# Patient Record
Sex: Female | Born: 1965 | Race: White | Hispanic: No | Smoking: Never smoker
Health system: Southern US, Community
[De-identification: ages and names within clinical notes are randomized; demographics above are authoritative.]

## PROBLEM LIST (undated history)

## (undated) DIAGNOSIS — R7303 Prediabetes: Secondary | ICD-10-CM

## (undated) DIAGNOSIS — G2581 Restless legs syndrome: Secondary | ICD-10-CM

## (undated) DIAGNOSIS — F419 Anxiety disorder, unspecified: Secondary | ICD-10-CM

## (undated) DIAGNOSIS — D649 Anemia, unspecified: Secondary | ICD-10-CM

## (undated) DIAGNOSIS — J45909 Unspecified asthma, uncomplicated: Secondary | ICD-10-CM

## (undated) DIAGNOSIS — E079 Disorder of thyroid, unspecified: Secondary | ICD-10-CM

## (undated) DIAGNOSIS — G4730 Sleep apnea, unspecified: Secondary | ICD-10-CM

## (undated) DIAGNOSIS — M1990 Unspecified osteoarthritis, unspecified site: Secondary | ICD-10-CM

## (undated) DIAGNOSIS — Z8489 Family history of other specified conditions: Secondary | ICD-10-CM

## (undated) DIAGNOSIS — E039 Hypothyroidism, unspecified: Secondary | ICD-10-CM

## (undated) HISTORY — PX: LEG SURGERY: SHX1003

## (undated) HISTORY — PX: BACK SURGERY: SHX140

## (undated) HISTORY — PX: ABDOMINAL HYSTERECTOMY: SHX81

## (undated) HISTORY — PX: APPENDECTOMY: SHX54

## (undated) HISTORY — PX: ENDOMETRIAL ABLATION: SHX621

---

## 2004-01-06 ENCOUNTER — Other Ambulatory Visit: Payer: Self-pay

## 2004-07-05 ENCOUNTER — Ambulatory Visit: Payer: Self-pay

## 2004-09-27 ENCOUNTER — Ambulatory Visit: Payer: Self-pay

## 2005-07-26 ENCOUNTER — Ambulatory Visit: Payer: Self-pay | Admitting: Obstetrics and Gynecology

## 2006-01-18 ENCOUNTER — Other Ambulatory Visit: Payer: Self-pay

## 2006-01-18 ENCOUNTER — Inpatient Hospital Stay: Payer: Self-pay | Admitting: Internal Medicine

## 2006-10-10 ENCOUNTER — Ambulatory Visit: Payer: Self-pay | Admitting: Family Medicine

## 2007-05-28 ENCOUNTER — Ambulatory Visit: Payer: Self-pay | Admitting: Obstetrics and Gynecology

## 2007-07-05 ENCOUNTER — Ambulatory Visit: Payer: Self-pay | Admitting: Orthopedic Surgery

## 2007-07-10 ENCOUNTER — Ambulatory Visit: Payer: Self-pay | Admitting: Orthopedic Surgery

## 2008-06-16 ENCOUNTER — Emergency Department: Payer: Self-pay | Admitting: Emergency Medicine

## 2008-09-02 ENCOUNTER — Ambulatory Visit: Payer: Self-pay

## 2010-02-15 ENCOUNTER — Ambulatory Visit: Payer: Self-pay

## 2010-12-08 ENCOUNTER — Ambulatory Visit (INDEPENDENT_AMBULATORY_CARE_PROVIDER_SITE_OTHER): Payer: Self-pay

## 2010-12-08 ENCOUNTER — Inpatient Hospital Stay (INDEPENDENT_AMBULATORY_CARE_PROVIDER_SITE_OTHER)
Admission: RE | Admit: 2010-12-08 | Discharge: 2010-12-08 | Disposition: A | Discharge status: Home / Self Care | Payer: Self-pay | Source: Ambulatory Visit | Attending: Family Medicine | Admitting: Family Medicine

## 2010-12-08 DIAGNOSIS — R071 Chest pain on breathing: Secondary | ICD-10-CM

## 2012-01-19 ENCOUNTER — Emergency Department: Payer: Self-pay | Admitting: Emergency Medicine

## 2013-05-09 ENCOUNTER — Emergency Department: Payer: Self-pay | Admitting: Emergency Medicine

## 2013-05-09 LAB — URINALYSIS, COMPLETE
Ketone: NEGATIVE
Leukocyte Esterase: NEGATIVE
Protein: NEGATIVE

## 2013-05-09 LAB — BASIC METABOLIC PANEL
Chloride: 108 mmol/L — ABNORMAL HIGH (ref 98–107)
Co2: 27 mmol/L (ref 21–32)
EGFR (Non-African Amer.): >60
Glucose: 95 mg/dL (ref 65–99)

## 2013-05-09 LAB — CBC
MCV: 80 fL (ref 80–100)
RBC: 4.34 10*6/uL (ref 3.80–5.20)
RDW: 14.9 % — ABNORMAL HIGH (ref 11.5–14.5)

## 2013-06-04 ENCOUNTER — Ambulatory Visit: Payer: Self-pay

## 2013-12-07 ENCOUNTER — Emergency Department: Payer: Self-pay | Admitting: Emergency Medicine

## 2013-12-07 LAB — BASIC METABOLIC PANEL
ANION GAP: 8 (ref 7–16)
BUN: 21 mg/dL — AB (ref 7–18)
Calcium, Total: 8.9 mg/dL (ref 8.5–10.1)
Chloride: 106 mmol/L (ref 98–107)
Co2: 24 mmol/L (ref 21–32)
Creatinine: 1.09 mg/dL (ref 0.60–1.30)
EGFR (African American): >60
GFR CALC NON AF AMER: 60 — AB
Glucose: 94 mg/dL (ref 65–99)
Osmolality: 278 (ref 275–301)
POTASSIUM: 3.9 mmol/L (ref 3.5–5.1)
SODIUM: 138 mmol/L (ref 136–145)

## 2013-12-07 LAB — MAGNESIUM: Magnesium: 1.7 mg/dL — ABNORMAL LOW

## 2014-09-19 ENCOUNTER — Emergency Department
Admit: 2014-09-19 | Disposition: A | Discharge status: Home / Self Care | Payer: Self-pay | Admitting: Emergency Medicine

## 2014-09-19 LAB — COMPREHENSIVE METABOLIC PANEL
ALBUMIN: 4.2 g/dL
ALK PHOS: 71 U/L
ALT: 21 U/L
ANION GAP: 8 (ref 7–16)
AST: 26 U/L
BILIRUBIN TOTAL: 0.6 mg/dL
BUN: 15 mg/dL
CALCIUM: 9.1 mg/dL
CHLORIDE: 104 mmol/L
CO2: 23 mmol/L
CREATININE: 1.06 mg/dL — AB
EGFR (African American): >60
EGFR (Non-African Amer.): >60
Glucose: 109 mg/dL — ABNORMAL HIGH
POTASSIUM: 3.8 mmol/L
SODIUM: 135 mmol/L
TOTAL PROTEIN: 7.9 g/dL

## 2014-09-19 LAB — URINALYSIS, COMPLETE
BILIRUBIN, UR: NEGATIVE
GLUCOSE, UR: NEGATIVE mg/dL (ref 0–75)
KETONE: NEGATIVE
Leukocyte Esterase: NEGATIVE
Nitrite: NEGATIVE
Ph: 5 (ref 4.5–8.0)
SPECIFIC GRAVITY: 1.03 (ref 1.003–1.030)
Squamous Epithelial: 13

## 2014-09-19 LAB — CBC
HCT: 36.2 % (ref 35.0–47.0)
HGB: 11.3 g/dL — ABNORMAL LOW (ref 12.0–16.0)
MCH: 24.9 pg — AB (ref 26.0–34.0)
MCHC: 31.1 g/dL — AB (ref 32.0–36.0)
MCV: 80 fL (ref 80–100)
PLATELETS: 283 10*3/uL (ref 150–440)
RBC: 4.52 10*6/uL (ref 3.80–5.20)
RDW: 15.7 % — AB (ref 11.5–14.5)
WBC: 10.4 10*3/uL (ref 3.6–11.0)

## 2014-11-10 DIAGNOSIS — D509 Iron deficiency anemia, unspecified: Secondary | ICD-10-CM | POA: Insufficient documentation

## 2014-12-15 ENCOUNTER — Ambulatory Visit: Payer: Self-pay

## 2015-03-22 DIAGNOSIS — F329 Major depressive disorder, single episode, unspecified: Secondary | ICD-10-CM | POA: Insufficient documentation

## 2015-06-10 DIAGNOSIS — J40 Bronchitis, not specified as acute or chronic: Secondary | ICD-10-CM | POA: Insufficient documentation

## 2015-09-17 ENCOUNTER — Emergency Department: Payer: BLUE CROSS/BLUE SHIELD

## 2015-09-17 ENCOUNTER — Emergency Department
Admission: EM | Admit: 2015-09-17 | Discharge: 2015-09-17 | Disposition: A | Discharge status: Home / Self Care | Payer: BLUE CROSS/BLUE SHIELD | Attending: Emergency Medicine | Admitting: Emergency Medicine

## 2015-09-17 ENCOUNTER — Encounter: Payer: Self-pay | Admitting: Emergency Medicine

## 2015-09-17 DIAGNOSIS — Y999 Unspecified external cause status: Secondary | ICD-10-CM | POA: Insufficient documentation

## 2015-09-17 DIAGNOSIS — X501XXA Overexertion from prolonged static or awkward postures, initial encounter: Secondary | ICD-10-CM | POA: Diagnosis not present

## 2015-09-17 DIAGNOSIS — S39012A Strain of muscle, fascia and tendon of lower back, initial encounter: Secondary | ICD-10-CM

## 2015-09-17 DIAGNOSIS — B372 Candidiasis of skin and nail: Secondary | ICD-10-CM | POA: Diagnosis not present

## 2015-09-17 DIAGNOSIS — Y939 Activity, unspecified: Secondary | ICD-10-CM | POA: Diagnosis not present

## 2015-09-17 DIAGNOSIS — Y929 Unspecified place or not applicable: Secondary | ICD-10-CM | POA: Diagnosis not present

## 2015-09-17 DIAGNOSIS — E119 Type 2 diabetes mellitus without complications: Secondary | ICD-10-CM | POA: Diagnosis not present

## 2015-09-17 DIAGNOSIS — E079 Disorder of thyroid, unspecified: Secondary | ICD-10-CM | POA: Diagnosis not present

## 2015-09-17 DIAGNOSIS — M545 Low back pain: Secondary | ICD-10-CM | POA: Diagnosis present

## 2015-09-17 HISTORY — DX: Disorder of thyroid, unspecified: E07.9

## 2015-09-17 HISTORY — DX: Restless legs syndrome: G25.81

## 2015-09-17 MED ORDER — NYSTATIN 100000 UNIT/GM EX CREA: 1.0000 "application " | TOPICAL_CREAM | Freq: Two times a day (BID) | CUTANEOUS | Status: DC

## 2015-09-17 MED ORDER — METHOCARBAMOL 500 MG PO TABS: 500.0000 mg | ORAL_TABLET | Freq: Four times a day (QID) | ORAL | Status: DC

## 2015-09-17 MED ORDER — FLUCONAZOLE 150 MG PO TABS: 150.0000 mg | ORAL_TABLET | Freq: Once | ORAL | Status: DC

## 2015-09-17 MED ORDER — MELOXICAM 15 MG PO TABS: 15.0000 mg | ORAL_TABLET | Freq: Every day | ORAL | Status: DC

## 2015-09-17 NOTE — ED Provider Notes (Signed)
[name] Emergency Department Provider Note  ____________________________________________  Time seen: Approximately 3:41 PM  I have reviewed the triage vital signs and the nursing notes.   HISTORY  Chief Complaint Back Pain    HPI Marie Booker is a 50 y.o. female who presents emergency department complaining of lower back pain 3 weeks. Patient states that she has had surgery to her lower back and has had some problems with stability in her legs. She states that her left leg gave out and she twisted and fell. She did not land on her back but states that she has had increasing aches and spasms in her lower back since occurrence. Patient denies any urinary symptoms complaints. Patient denies any radicular symptoms. She denies any bowel or bladder dysfunction, paresthesias, numbness or tingling in distal extremity.  Patient also endorses a cutaneous yeast infection to the skin folds of her lower abdomen. Patient states that she frequently gets these during the warmer months. She is requesting treatment for same.   Past Medical History  Diagnosis Date  . Diabetes mellitus without complication (HCC)   . Thyroid disease   . RLS (restless legs syndrome)     There are no active problems to display for this patient.   Past Surgical History  Procedure Laterality Date  . Back surgery    . Leg surgery      Current Outpatient Rx  Name  Route  Sig  Dispense  Refill  . fluconazole (DIFLUCAN) 150 MG tablet   Oral   Take 1 tablet (150 mg total) by mouth once.   1 tablet   0   . meloxicam (MOBIC) 15 MG tablet   Oral   Take 1 tablet (15 mg total) by mouth daily.   30 tablet   0   . methocarbamol (ROBAXIN) 500 MG tablet   Oral   Take 1 tablet (500 mg total) by mouth 4 (four) times daily.   16 tablet   0   . nystatin cream (MYCOSTATIN)   Topical   Apply 1 application topically 2 (two) times daily.   30 g   0     Allergies Dilaudid  History  reviewed. No pertinent family history.  Social History Social History  Substance Use Topics  . Smoking status: Never Smoker   . Smokeless tobacco: None  . Alcohol Use: No     Review of Systems  Constitutional: No fever/chills Cardiovascular: no chest pain. Respiratory: no cough. No SOB. Gastrointestinal: No abdominal pain.  No nausea, no vomiting.   Genitourinary: Negative for dysuria. No hematuria Musculoskeletal: Positive for lower back pain. Skin: Positive for yeast infection to skin folds of the lower abdomen. Neurological: Negative for headaches, focal weakness or numbness. 10-point ROS otherwise negative.  ____________________________________________   PHYSICAL EXAM:  VITAL SIGNS: ED Triage Vitals  Enc Vitals Group     BP 09/17/15 1415 132/68 mmHg     Pulse Rate 09/17/15 1415 76     Resp 09/17/15 1415 18     Temp 09/17/15 1415 98 F (36.7 C)     Temp Source 09/17/15 1415 Oral     SpO2 09/17/15 1415 98 %     Weight 09/17/15 1415 260 lb (117.935 kg)     Height 09/17/15 1415  (1.778 m)     Head Cir --      Peak Flow --      Pain Score 09/17/15 1416 7     Pain Loc --  Pain Edu? --      Excl. in GC? --      Constitutional: Alert and oriented. Well appearing and in no acute distress. Eyes: Conjunctivae are normal. PERRL. EOMI. Head: Atraumatic. Cardiovascular: Normal rate, regular rhythm. Normal S1 and S2.  Good peripheral circulation. Respiratory: Normal respiratory effort without tachypnea or retractions. Lungs CTAB. Gastrointestinal:  No CVA tenderness. Musculoskeletal: No visible deformity to spine upon inspection. Patient is tender to palpation midline spinal processes. Patient is diffuse tender to palpation over the paraspinal muscle groups bilaterally in the lumbar region. No point tenderness. No palpable abnormality. Patient is nontender to palpation over sciatic notches. Negative straight leg raise bilaterally. Dorsalis pedis pulses appreciated  distally bilaterally. Sensation intact and equal lower extremities. Neurologic:  Normal speech and language. No gross focal neurologic deficits are appreciated.  Skin:  Skin is warm, dry and intact. Minor erythema noted in the skin folds lower abdomen. He does have white coating consistent with yeast. Psychiatric: Mood and affect are normal. Speech and behavior are normal. Patient exhibits appropriate insight and judgement.   ____________________________________________   LABS (all labs ordered are listed, but only abnormal results are displayed)  Labs Reviewed - No data to display ____________________________________________  EKG   ____________________________________________  RADIOLOGY Marie Booker, personally viewed and evaluated these images (plain radiographs) as part of my medical decision making, as well as reviewing the written report by the radiologist.  Dg Lumbar Spine Complete  09/17/2015  CLINICAL DATA:  Patient slipped 3 weeks ago with persistent left-sided back pain radiating towards lower extremity. EXAM: LUMBAR SPINE - COMPLETE 4+ VIEW COMPARISON:  CT 09/20/2014 . FINDINGS: Exam demonstrates mild spondylosis throughout the lumbar spine. There is disc space narrowing most prominent at the L4-5 level and to a lesser extent at the L3-4 and L5-S1 levels. There is a mild grade 1 anterolisthesis of L4 with respect to L3 and L5 likely due to moderate facet arthropathy unchanged. No evidence of compression fracture. There are degenerative changes of the sacroiliac joints and hips. IMPRESSION: No acute findings per Mild spondylosis throughout the lumbar spine to include facet arthropathy. Disc disease worse at the L4-5 level and to lesser extent at the L3-4 and L5-S1 levels. Mild grade 1 anterolisthesis of L4 with respect to L3 and L5 likely due to facet arthropathy and unchanged. Electronically Signed   By: Elberta Fortis M.D.   On: 09/17/2015 15:34     ____________________________________________    PROCEDURES  Procedure(s) performed:       Medications - No data to display   ____________________________________________   INITIAL IMPRESSION / ASSESSMENT AND PLAN / ED COURSE  Pertinent labs & imaging results that were available during my care of the patient were reviewed by me and considered in my medical decision making (see chart for details).  Patient's diagnosis is consistent with lumbar strain and cutaneous yeast infection. Patient's x-ray is negative. The emergency department. Patient's exam is reassuring.. Patient will be discharged home with prescriptions for anti-inflammatories muscle relaxers and medication for yeast infection. Patient is to follow-up with neurosurgeon in 2-3 weeks medications have not relieved symptoms of back pain.. Patient is to follow up with primary care provider if symptoms of yeast infection persist past this treatment course. Patient is given ED precautions to return to the ED for any worsening or new symptoms.     ____________________________________________  FINAL CLINICAL IMPRESSION(S) / ED DIAGNOSES  Final diagnoses:  Strain of lumbar paraspinal muscle, initial encounter  Candidiasis  of skin      NEW MEDICATIONS STARTED DURING THIS VISIT:  New Prescriptions   FLUCONAZOLE (DIFLUCAN) 150 MG TABLET    Take 1 tablet (150 mg total) by mouth once.   MELOXICAM (MOBIC) 15 MG TABLET    Take 1 tablet (15 mg total) by mouth daily.   METHOCARBAMOL (ROBAXIN) 500 MG TABLET    Take 1 tablet (500 mg total) by mouth 4 (four) times daily.   NYSTATIN CREAM (MYCOSTATIN)    Apply 1 application topically 2 (two) times daily.        This chart was dictated using voice recognition software/Dragon. Despite best efforts to proofread, errors can occur which can change the meaning. Any change was purely unintentional.    Racheal PatchesJonathan D Jordie Skalsky, PA-C 09/17/15 1559  Loleta Roseory Forbach, MD 09/18/15  [phone]

## 2015-09-17 NOTE — Discharge Instructions (Signed)
Lumbosacral Strain Lumbosacral strain is a strain of any of the parts that make up your lumbosacral vertebrae. Your lumbosacral vertebrae are the bones that make up the lower third of your backbone. Your lumbosacral vertebrae are held together by muscles and tough, fibrous tissue (ligaments).  CAUSES  A sudden blow to your back can cause lumbosacral strain. Also, anything that causes an excessive stretch of the muscles in the low back can cause this strain. This is typically seen when people exert themselves strenuously, fall, lift heavy objects, bend, or crouch repeatedly. RISK FACTORS  Physically demanding work.  Participation in pushing or pulling sports or sports that require a sudden twist of the back (tennis, golf, baseball).  Weight lifting.  Excessive lower back curvature.  Forward-tilted pelvis.  Weak back or abdominal muscles or both.  Tight hamstrings. SIGNS AND SYMPTOMS  Lumbosacral strain may cause pain in the area of your injury or pain that moves (radiates) down your leg.  DIAGNOSIS Your health care provider can often diagnose lumbosacral strain through a physical exam. In some cases, you may need tests such as X-ray exams.  TREATMENT  Treatment for your lower back injury depends on many factors that your clinician will have to evaluate. However, most treatment will include the use of anti-inflammatory medicines. HOME CARE INSTRUCTIONS   Avoid hard physical activities (tennis, racquetball, waterskiing) if you are not in proper physical condition for it. This may aggravate or create problems.  If you have a back problem, avoid sports requiring sudden body movements. Swimming and walking are generally safer activities.  Maintain good posture.  Maintain a healthy weight.  For acute conditions, you may put ice on the injured area.  Put ice in a plastic bag.  Place a towel between your skin and the bag.  Leave the ice on for 20 minutes, 2-3 times a day.  When the  low back starts healing, stretching and strengthening exercises may be recommended. SEEK MEDICAL CARE IF:  Your back pain is getting worse.  You experience severe back pain not relieved with medicines. SEEK IMMEDIATE MEDICAL CARE IF:   You have numbness, tingling, weakness, or problems with the use of your arms or legs.  There is a change in bowel or bladder control.  You have increasing pain in any area of the body, including your belly (abdomen).  You notice shortness of breath, dizziness, or feel faint.  You feel sick to your stomach (nauseous), are throwing up (vomiting), or become sweaty.  You notice discoloration of your toes or legs, or your feet get very cold. MAKE SURE YOU:   Understand these instructions.  Will watch your condition.  Will get help right away if you are not doing well or get worse.   This information is not intended to replace advice given to you by your health care provider. Make sure you discuss any questions you have with your health care provider.   Document Released: 03/15/2005 Document Revised: 06/26/2014 Document Reviewed: 01/22/2013 Elsevier Interactive Patient Education 2016 [employer]  Cutaneous Candidiasis Cutaneous candidiasis is a condition in which there is an overgrowth of yeast (candida) on the skin. Yeast normally live on the skin, but in small enough numbers not to cause any symptoms. In certain cases, increased growth of the yeast may cause an actual yeast infection. This kind of infection usually occurs in areas of the skin that are constantly warm and moist, such as the armpits or the groin. Yeast is the most common cause of  diaper rash in babies and in people who cannot control their bowel movements (incontinence). CAUSES  The fungus that most often causes cutaneous candidiasis is Candida albicans. Conditions that can increase the risk of getting a yeast infection of the skin  include:  Obesity.  Pregnancy.  Diabetes.  Taking antibiotic medicine.  Taking birth control pills.  Taking steroid medicines.  Thyroid disease.  An iron or zinc deficiency.  Problems with the immune system. SYMPTOMS   Red, swollen area of the skin.  Bumps on the skin.  Itchiness. DIAGNOSIS  The diagnosis of cutaneous candidiasis is usually based on its appearance. Light scrapings of the skin may also be taken and viewed under a microscope to identify the presence of yeast. TREATMENT  Antifungal creams may be applied to the infected skin. In severe cases, oral medicines may be needed.  HOME CARE INSTRUCTIONS   Keep your skin clean and dry.  Maintain a healthy weight.  If you have diabetes, keep your blood sugar under control. SEEK IMMEDIATE MEDICAL CARE IF:  Your rash continues to spread despite treatment.  You have a fever, chills, or abdominal pain.   This information is not intended to replace advice given to you by your health care provider. Make sure you discuss any questions you have with your health care provider.   Document Released: 02/21/2011 Document Revised: 08/28/2011 Document Reviewed: 12/07/2014 Elsevier Interactive Patient Education [employer]

## 2015-09-17 NOTE — ED Notes (Signed)
Pt has had intermittent lower back pain and knee pain.  Pt did fall 3 weeks ago and not sure if this triggered it.  Has had lumbar laminectomy end of last year and this is where back pain is.  Denies loss of bowel or bladder.  Pain worst when laying flat.

## 2016-02-03 ENCOUNTER — Other Ambulatory Visit: Payer: Self-pay | Admitting: Specialist

## 2016-02-03 DIAGNOSIS — M545 Low back pain: Secondary | ICD-10-CM

## 2016-02-11 ENCOUNTER — Ambulatory Visit: Payer: BLUE CROSS/BLUE SHIELD

## 2016-02-13 ENCOUNTER — Emergency Department
Admission: EM | Admit: 2016-02-13 | Discharge: 2016-02-13 | Discharge status: Against Medical Advice | Payer: BLUE CROSS/BLUE SHIELD

## 2016-02-13 NOTE — ED Notes (Signed)
Pt called for triage multiple times with no answer.

## 2016-04-14 DIAGNOSIS — F419 Anxiety disorder, unspecified: Secondary | ICD-10-CM | POA: Insufficient documentation

## 2016-05-15 ENCOUNTER — Emergency Department
Admission: EM | Admit: 2016-05-15 | Discharge: 2016-05-15 | Disposition: A | Discharge status: Home / Self Care | Payer: BLUE CROSS/BLUE SHIELD | Attending: Emergency Medicine | Admitting: Emergency Medicine

## 2016-05-15 ENCOUNTER — Encounter: Payer: Self-pay | Admitting: Emergency Medicine

## 2016-05-15 DIAGNOSIS — E119 Type 2 diabetes mellitus without complications: Secondary | ICD-10-CM | POA: Diagnosis not present

## 2016-05-15 DIAGNOSIS — R1110 Vomiting, unspecified: Secondary | ICD-10-CM

## 2016-05-15 DIAGNOSIS — R112 Nausea with vomiting, unspecified: Secondary | ICD-10-CM | POA: Insufficient documentation

## 2016-05-15 DIAGNOSIS — R109 Unspecified abdominal pain: Secondary | ICD-10-CM | POA: Insufficient documentation

## 2016-05-15 DIAGNOSIS — R197 Diarrhea, unspecified: Secondary | ICD-10-CM | POA: Insufficient documentation

## 2016-05-15 LAB — COMPREHENSIVE METABOLIC PANEL
ALBUMIN: 4.3 g/dL (ref 3.5–5.0)
ALK PHOS: 67 U/L (ref 38–126)
ALT: 17 U/L (ref 14–54)
ANION GAP: 8 (ref 5–15)
AST: 24 U/L (ref 15–41)
BILIRUBIN TOTAL: 0.8 mg/dL (ref 0.3–1.2)
BUN: 15 mg/dL (ref 6–20)
CO2: 24 mmol/L (ref 22–32)
Calcium: 9.1 mg/dL (ref 8.9–10.3)
Chloride: 102 mmol/L (ref 101–111)
Creatinine, Ser: 0.96 mg/dL (ref 0.44–1.00)
GFR calc Af Amer: >60 mL/min (ref >60)
GFR calc non Af Amer: >60 mL/min (ref >60)
GLUCOSE: 106 mg/dL — AB (ref 65–99)
POTASSIUM: 3.9 mmol/L (ref 3.5–5.1)
SODIUM: 134 mmol/L — AB (ref 135–145)
Total Protein: 7.8 g/dL (ref 6.5–8.1)

## 2016-05-15 LAB — CBC
HEMATOCRIT: 44.5 % (ref 35.0–47.0)
HEMOGLOBIN: 14.9 g/dL (ref 12.0–16.0)
MCH: 28.6 pg (ref 26.0–34.0)
MCHC: 33.5 g/dL (ref 32.0–36.0)
MCV: 85.4 fL (ref 80.0–100.0)
Platelets: 217 10*3/uL (ref 150–440)
RBC: 5.21 MIL/uL — ABNORMAL HIGH (ref 3.80–5.20)
RDW: 13.1 % (ref 11.5–14.5)
WBC: 7.8 10*3/uL (ref 3.6–11.0)

## 2016-05-15 LAB — LIPASE, BLOOD: Lipase: 22 U/L (ref 11–51)

## 2016-05-15 LAB — TROPONIN I: Troponin I: <0.03 ng/mL (ref <0.03)

## 2016-05-15 MED ORDER — ONDANSETRON HCL 4 MG/2ML IJ SOLN
4.0000 mg | Freq: Once | INTRAMUSCULAR | Status: AC
  Administered 2016-05-15: 4 mg via INTRAVENOUS
  Filled 2016-05-15: qty 2

## 2016-05-15 MED ORDER — ONDANSETRON HCL 4 MG PO TABS: 4.0000 mg | ORAL_TABLET | Freq: Three times a day (TID) | ORAL | 0 refills | Status: DC | PRN

## 2016-05-15 MED ORDER — CYCLOBENZAPRINE HCL 5 MG PO TABS: 5.0000 mg | ORAL_TABLET | Freq: Three times a day (TID) | ORAL | 0 refills | Status: DC | PRN

## 2016-05-15 MED ORDER — SODIUM CHLORIDE 0.9 % IV BOLUS (SEPSIS)
1000.0000 mL | Freq: Once | INTRAVENOUS | Status: AC
  Administered 2016-05-15: 1000 mL via INTRAVENOUS

## 2016-05-15 MED ORDER — CYCLOBENZAPRINE HCL 10 MG PO TABS
5.0000 mg | ORAL_TABLET | Freq: Once | ORAL | Status: AC
  Administered 2016-05-15: 5 mg via ORAL
  Filled 2016-05-15: qty 1

## 2016-05-15 NOTE — ED Notes (Signed)
Pt reports she has been vomiting for the last 48 hours and diarrhea - she states she has pulled a muscle in her chest/back due to the vomiting - vomited 20+ times in 24 hours - 15 loose stools in 24 hours

## 2016-05-15 NOTE — ED Triage Notes (Signed)
Nausea, vomiting, abd pain and chest pain and diarrhea x 2 days.

## 2016-05-15 NOTE — Discharge Instructions (Signed)
Please seek medical attention for any high fevers, chest pain, shortness of breath, change in behavior, persistent vomiting, bloody stool or any other new or concerning symptoms.  

## 2016-05-15 NOTE — ED Provider Notes (Signed)
[facility] Emergency Department Provider Note    ____________________________________________   I have reviewed the triage vital signs and the nursing notes.   HISTORY  Chief Complaint Abdominal Pain and Chest Pain   History limited by: Not Limited   HPI Marie Booker is a 50 y.o. female who presents to the emergency department today because of concerns for nausea, vomiting, diarrhea, back and chest pain. The patient states that her symptoms started yesterday. She states she has now vomited and had multiple episodes of diarrhea. She has noticed some blood and occasionally vomits. Since the vomiting and diarrhea started she has felt extremely weak and dehydrated. In addition she thinks she has pulled her lower back and a muscle in her chest. She does have a history of chronic lower back pain and states that the tramadol she has been taking at home is not helping with it. She denies any known sick contacts. Denies any abnormal ingestions.   Past Medical History:  Diagnosis Date  . Diabetes mellitus without complication (HCC)   . RLS (restless legs syndrome)   . Thyroid disease     There are no active problems to display for this patient.   Past Surgical History:  Procedure Laterality Date  . BACK SURGERY    . LEG SURGERY      Prior to Admission medications   Medication Sig Start Date End Date Taking? Authorizing Provider  fluconazole (DIFLUCAN) 150 MG tablet Take 1 tablet (150 mg total) by mouth once. 09/17/15   Delorise RoyalsJonathan D Cuthriell, PA-C  meloxicam (MOBIC) 15 MG tablet Take 1 tablet (15 mg total) by mouth daily. 09/17/15   Delorise RoyalsJonathan D Cuthriell, PA-C  methocarbamol (ROBAXIN) 500 MG tablet Take 1 tablet (500 mg total) by mouth 4 (four) times daily. 09/17/15   Delorise RoyalsJonathan D Cuthriell, PA-C  nystatin cream (MYCOSTATIN) Apply 1 application topically 2 (two) times daily. 09/17/15   Delorise RoyalsJonathan D Cuthriell, PA-C    Allergies Dilaudid [hydromorphone hcl]  No  family history on file.  Social History Social History  Substance Use Topics  . Smoking status: Never Smoker  . Smokeless tobacco: Not on file  . Alcohol use No    Review of Systems  Constitutional: Negative for fever. Cardiovascular: Positive for chest pain. Respiratory: Negative for shortness of breath. Gastrointestinal: Positive for abdominal pain, nausea, vomiting, diarrhea. Genitourinary: Negative for dysuria. Musculoskeletal: Positive for back pain. Skin: Negative for rash. Neurological: Negative for headaches, focal weakness or numbness.  10-point ROS otherwise negative.  ____________________________________________   PHYSICAL EXAM:  VITAL SIGNS: ED Triage Vitals  Enc Vitals Group     BP 05/15/16 1508 (!) 137/98     Pulse Rate 05/15/16 1508 (!) 102     Resp 05/15/16 1508 20     Temp 05/15/16 1508 98.6 F (37 C)     Temp Source 05/15/16 1508 Oral     SpO2 05/15/16 1508 96 %     Weight 05/15/16 1508 251 lb (113.9 kg)     Height 05/15/16 1508 5\' 10"  (1.778 m)     Head Circumference --      Peak Flow --      Pain Score 05/15/16 1513 10   Constitutional: Alert and oriented. Well appearing and in no distress. Eyes: Conjunctivae are normal. Normal extraocular movements. ENT   Head: Normocephalic and atraumatic.   Nose: No congestion/rhinnorhea.   Mouth/Throat: Mucous membranes are moist.   Neck: No stridor. Hematological/Lymphatic/Immunilogical: No cervical lymphadenopathy. Cardiovascular: Normal rate, regular  rhythm.  No murmurs, rubs, or gallops.  Respiratory: Normal respiratory effort without tachypnea nor retractions. Breath sounds are clear and equal bilaterally. No wheezes/rales/rhonchi. Gastrointestinal: Soft and somewhat diffusely tender to palpation.  Genitourinary: Deferred Musculoskeletal: Normal range of motion in all extremities. No lower extremity edema. Tender to palpation in the right lower back. Neurologic:  Normal speech and  language. No gross focal neurologic deficits are appreciated.  Skin:  Skin is warm, dry and intact. No rash noted. Psychiatric: Mood and affect are normal. Speech and behavior are normal. Patient exhibits appropriate insight and judgment.  ____________________________________________    LABS (pertinent positives/negatives)  Labs Reviewed  COMPREHENSIVE METABOLIC PANEL - Abnormal; Notable for the following:       Result Value   Sodium 134 (*)    Glucose, Bld 106 (*)    All other components within normal limits  CBC - Abnormal; Notable for the following:    RBC 5.21 (*)    All other components within normal limits  LIPASE, BLOOD  TROPONIN I     ____________________________________________   EKG  I, Phineas SemenGraydon Cylah Fannin, attending physician, personally viewed and interpreted this EKG  EKG Time: 1507 Rate: 103 Rhythm: sinus tachycardia Axis: normal Intervals: qtc 421 QRS: narrow ST changes: no st elevation Impression: abnormal ekg  ____________________________________________    RADIOLOGY  None   ____________________________________________   PROCEDURES  Procedures  ____________________________________________   INITIAL IMPRESSION / ASSESSMENT AND PLAN / ED COURSE  Pertinent labs & imaging results that were available during my care of the patient were reviewed by me and considered in my medical decision making (see chart for details).  Patient here with nausea and vomiting. He subsequently developed back and chest pain which she thinks is likely due to muscle strains. On exam patient appears slightly uncomfortable. Will give IV fluids, Flexeril and reassess.  Clinical Course    Patient states she feels much better after fluids and medication. She was able to get up and walk to the bathroom with much less weakness. Will plan on discharging home with muscle relaxer for back and nausea medicine. ____________________________________________   FINAL CLINICAL  IMPRESSION(S) / ED DIAGNOSES  Final diagnoses:  Vomiting and diarrhea     Note: This dictation was prepared with Dragon dictation. Any transcriptional errors that result from this process are unintentional    Phineas SemenGraydon Eulla Kochanowski, MD 05/15/16 1940

## 2017-01-17 ENCOUNTER — Emergency Department
Admission: EM | Admit: 2017-01-17 | Discharge: 2017-01-17 | Disposition: A | Discharge status: Home / Self Care | Payer: BLUE CROSS/BLUE SHIELD | Attending: Emergency Medicine | Admitting: Emergency Medicine

## 2017-01-17 DIAGNOSIS — Z79899 Other long term (current) drug therapy: Secondary | ICD-10-CM | POA: Diagnosis not present

## 2017-01-17 DIAGNOSIS — R112 Nausea with vomiting, unspecified: Secondary | ICD-10-CM | POA: Insufficient documentation

## 2017-01-17 DIAGNOSIS — E119 Type 2 diabetes mellitus without complications: Secondary | ICD-10-CM | POA: Insufficient documentation

## 2017-01-17 DIAGNOSIS — R197 Diarrhea, unspecified: Secondary | ICD-10-CM | POA: Insufficient documentation

## 2017-01-17 DIAGNOSIS — M549 Dorsalgia, unspecified: Secondary | ICD-10-CM | POA: Insufficient documentation

## 2017-01-17 DIAGNOSIS — G8929 Other chronic pain: Secondary | ICD-10-CM | POA: Diagnosis not present

## 2017-01-17 DIAGNOSIS — R109 Unspecified abdominal pain: Secondary | ICD-10-CM | POA: Diagnosis present

## 2017-01-17 LAB — CBC
HCT: 41.4 % (ref 35.0–47.0)
HEMOGLOBIN: 14.2 g/dL (ref 12.0–16.0)
MCH: 29.9 pg (ref 26.0–34.0)
MCHC: 34.3 g/dL (ref 32.0–36.0)
MCV: 87.1 fL (ref 80.0–100.0)
PLATELETS: 254 10*3/uL (ref 150–440)
RBC: 4.76 MIL/uL (ref 3.80–5.20)
RDW: 14.7 % — ABNORMAL HIGH (ref 11.5–14.5)
WBC: 8.9 10*3/uL (ref 3.6–11.0)

## 2017-01-17 LAB — COMPREHENSIVE METABOLIC PANEL
ALK PHOS: 71 U/L (ref 38–126)
ALT: 30 U/L (ref 14–54)
ANION GAP: 10 (ref 5–15)
AST: 41 U/L (ref 15–41)
Albumin: 5 g/dL (ref 3.5–5.0)
BILIRUBIN TOTAL: 0.7 mg/dL (ref 0.3–1.2)
BUN: 18 mg/dL (ref 6–20)
CALCIUM: 9.4 mg/dL (ref 8.9–10.3)
CO2: 22 mmol/L (ref 22–32)
CREATININE: 1.17 mg/dL — AB (ref 0.44–1.00)
Chloride: 107 mmol/L (ref 101–111)
GFR, EST NON AFRICAN AMERICAN: 52 mL/min — AB (ref >60)
Glucose, Bld: 97 mg/dL (ref 65–99)
Potassium: 3.9 mmol/L (ref 3.5–5.1)
SODIUM: 139 mmol/L (ref 135–145)
TOTAL PROTEIN: 7.9 g/dL (ref 6.5–8.1)

## 2017-01-17 LAB — URINALYSIS, COMPLETE (UACMP) WITH MICROSCOPIC
Bacteria, UA: NONE SEEN
Bilirubin Urine: NEGATIVE
GLUCOSE, UA: NEGATIVE mg/dL
Ketones, ur: NEGATIVE mg/dL
NITRITE: NEGATIVE
PH: 5 (ref 5.0–8.0)
Protein, ur: NEGATIVE mg/dL
Specific Gravity, Urine: 1.027 (ref 1.005–1.030)

## 2017-01-17 LAB — LIPASE, BLOOD: Lipase: 32 U/L (ref 11–51)

## 2017-01-17 LAB — POCT PREGNANCY, URINE: Preg Test, Ur: NEGATIVE

## 2017-01-17 MED ORDER — ONDANSETRON HCL 4 MG/2ML IJ SOLN
4.0000 mg | Freq: Once | INTRAMUSCULAR | Status: AC
  Administered 2017-01-17: 4 mg via INTRAVENOUS

## 2017-01-17 MED ORDER — ONDANSETRON HCL 4 MG/2ML IJ SOLN
INTRAMUSCULAR | Status: AC
  Administered 2017-01-17: 4 mg via INTRAVENOUS
  Filled 2017-01-17: qty 2

## 2017-01-17 MED ORDER — MORPHINE SULFATE (PF) 4 MG/ML IV SOLN
INTRAVENOUS | Status: AC
  Administered 2017-01-17: 4 mg via INTRAVENOUS
  Filled 2017-01-17: qty 1

## 2017-01-17 MED ORDER — SODIUM CHLORIDE 0.9 % IV BOLUS (SEPSIS)
1000.0000 mL | Freq: Once | INTRAVENOUS | Status: AC
  Administered 2017-01-17: 1000 mL via INTRAVENOUS

## 2017-01-17 MED ORDER — ONDANSETRON HCL 4 MG PO TABS: 4.0000 mg | ORAL_TABLET | Freq: Every day | ORAL | 0 refills | Status: DC | PRN

## 2017-01-17 MED ORDER — MORPHINE SULFATE (PF) 4 MG/ML IV SOLN
4.0000 mg | Freq: Once | INTRAVENOUS | Status: AC
  Administered 2017-01-17: 4 mg via INTRAVENOUS

## 2017-01-17 NOTE — ED Notes (Signed)
poct pregnancy Negative 

## 2017-01-17 NOTE — ED Provider Notes (Addendum)
[facility] Emergency Department Provider Note  ____________________________________________   I have reviewed the triage vital signs and the nursing notes.   HISTORY  Chief Complaint Abdominal Pain and Diarrhea    HPI Marie Booker is a 51 y.o. female who presents today complaining of requiring morphine essentially. Patient states that she has chronic back pain, and she takes tramadol for this with the tramadol upsets her stomach. She is scheduled for back surgery for her chronic back pain and a couple weeks and she is trying to "make it". Patient has Percocet which doesn't seem to upset her stomach or for some reason she doesn't think that except for at night. Therefore, she is here because the last 2 weeks whenever she takes her tramadol she gets an upset stomach but he was stools and sometimes nausea. She is here because of these reasons and also she needs IV morphine she states. She states that her pain gets as bad and she is due for her pain meds that she can't take continued IV morphine. She states she does have to emergency room for this with some frequency although I have not verified this. She is not having fever chills or focal abdominal pain, she just feels that her chronic back pain is causing her to take a lot of pain medication and the pain medication makes her sick. She does not have any chest pain or shortness of breath she denies any dysuria or urinary frequency, she actually is not having diarrhea today, she states she was very nauseated today and only had 3 crackers to eat. Aside from morbid obesity and chronic back pain, patient appears to be largely healthy. She does have diabetes mellitus, but her sugars are probably been well-controlled.   Past Medical History:  Diagnosis Date  . Diabetes mellitus without complication (HCC)   . RLS (restless legs syndrome)   . Thyroid disease     There are no active problems to display for this patient.   Past  Surgical History:  Procedure Laterality Date  . BACK SURGERY    . LEG SURGERY      Prior to Admission medications   Medication Sig Start Date End Date Taking? Authorizing Provider  cyclobenzaprine (FLEXERIL) 5 MG tablet Take 1 tablet (5 mg total) by mouth 3 (three) times daily as needed for muscle spasms. 05/15/16   Phineas SemenGoodman, Graydon, MD  fluconazole (DIFLUCAN) 150 MG tablet Take 1 tablet (150 mg total) by mouth once. 09/17/15   Cuthriell, Delorise RoyalsJonathan D, PA-C  meloxicam (MOBIC) 15 MG tablet Take 1 tablet (15 mg total) by mouth daily. 09/17/15   Cuthriell, Delorise RoyalsJonathan D, PA-C  methocarbamol (ROBAXIN) 500 MG tablet Take 1 tablet (500 mg total) by mouth 4 (four) times daily. 09/17/15   Cuthriell, Delorise RoyalsJonathan D, PA-C  nystatin cream (MYCOSTATIN) Apply 1 application topically 2 (two) times daily. 09/17/15   Cuthriell, Delorise RoyalsJonathan D, PA-C  ondansetron (ZOFRAN) 4 MG tablet Take 1 tablet (4 mg total) by mouth every 8 (eight) hours as needed for nausea or vomiting. 05/15/16   Phineas SemenGoodman, Graydon, MD    Allergies Dilaudid [hydromorphone hcl]  No family history on file.  Social History Social History  Substance Use Topics  . Smoking status: Never Smoker  . Smokeless tobacco: Not on file  . Alcohol use No    Review of Systems Constitutional: No fever/chills Eyes: No visual changes. ENT: No sore throat. No stiff neck no neck pain Cardiovascular: Denies chest pain. Respiratory: Denies shortness of breath.  Gastrointestinal:   See history of present illness Genitourinary: Negative for dysuria. Musculoskeletal: Negative lower extremity swelling Skin: Negative for rash. Neurological: Negative for severe headaches, focal weakness or numbness.   ____________________________________________   PHYSICAL EXAM:  VITAL SIGNS: ED Triage Vitals  Enc Vitals Group     BP 01/17/17 1700 (!) 145/85     Pulse Rate 01/17/17 1700 63     Resp 01/17/17 1700 20     Temp 01/17/17 1700 98.2 F (36.8 C)     Temp Source  01/17/17 1700 Oral     SpO2 01/17/17 1700 99 %     Weight 01/17/17 1700 265 lb (120.2 kg)     Height 01/17/17 1700 5\' 7"  (1.702 m)     Head Circumference --      Peak Flow --      Pain Score 01/17/17 1659 6     Pain Loc --      Pain Edu? --      Excl. in GC? --     Constitutional: Alert and oriented. Well appearing and in no acute distress.She is anxious and upset but in no acute medical distress. Eyes: Conjunctivae are normal Head: Atraumatic HEENT: No congestion/rhinnorhea. Mucous membranes are moist.  Oropharynx non-erythematous Neck:   Nontender with no meningismus, no masses, no stridor Cardiovascular: Normal rate, regular rhythm. Grossly normal heart sounds.  Good peripheral circulation. Respiratory: Normal respiratory effort.  No retractions. Lungs CTAB. Abdominal: Soft and nontender. No distention. No guarding no rebound morbid obesity noted Back:  There is no focal tenderness or step off.  there is no midline tenderness there are no lesions noted. there is no CVA tenderness Musculoskeletal: No lower extremity tenderness, no upper extremity tenderness. No joint effusions, no DVT signs strong distal pulses no edema Neurologic:  Normal speech and language. No gross focal neurologic deficits are appreciated.  Skin:  Skin is warm, dry and intact. No rash noted. Psychiatric: Mood and affect are normal. Speech and behavior are normal.  ____________________________________________   LABS (all labs ordered are listed, but only abnormal results are displayed)  Labs Reviewed  COMPREHENSIVE METABOLIC PANEL - Abnormal; Notable for the following:       Result Value   Creatinine, Ser 1.17 (*)    GFR calc non Af Amer 52 (*)    All other components within normal limits  CBC - Abnormal; Notable for the following:    RDW 14.7 (*)    All other components within normal limits  URINALYSIS, COMPLETE (UACMP) WITH MICROSCOPIC - Abnormal; Notable for the following:    Color, Urine YELLOW (*)     APPearance HAZY (*)    Hgb urine dipstick SMALL (*)    Leukocytes, UA TRACE (*)    Squamous Epithelial / LPF 6-30 (*)    All other components within normal limits  LIPASE, BLOOD  POC URINE PREG, ED  POCT PREGNANCY, URINE   ____________________________________________  EKG  I personally interpreted any EKGs ordered by me or triage  ____________________________________________  RADIOLOGY  I reviewed any imaging ordered by me or triage that were performed during my shift and, if possible, patient and/or family made aware of any abnormal findings. ____________________________________________   PROCEDURES  Procedure(s) performed: None  Procedures  Critical Care performed: None  ____________________________________________   INITIAL IMPRESSION / ASSESSMENT AND PLAN / ED COURSE  Pertinent labs & imaging results that were available during my care of the patient were reviewed by me and considered in my medical decision  making (see chart for details).  Patient here for nausea vomiting diarrhea which she states is been going on for 2 weeks because of her pain medications. Her tongue she takes her pain medications, tramadol, she has these reactions. She does have other pain medications which also causes reaction but for some reason she is not taking them. It is very unclear why. Patient is here because she wants IV morphine. I initially thought she could not have this in that she had a ride home. She has not selected a ride. We will therefore give her pain medication. I have explained that usually we do not give chronic pain patients IV morphine that I worry patient may be having some early withdrawal symptoms which would just exacerbate nausea and diarrhea that she states she is having. Patient has not had any vomiting here. She states she might vomit though. She is very tearful and upset. She very much would like IV narcotics. Her abdomen is completely benign, her vital signs are  reassuring, she is not pregnant, her lipase is reassuring, her liver function tests are reassuring, her creatinine is reassuring, no evidence of significant dehydration, white count is reassuring, urinalysis is reassuring, abdominal exam is completely reassuring. Very reassuring exam and workup. We'll give her some pain meds and states she feels better.   ----------------------------------------- 8:00 PM on 01/17/2017 ----------------------------------------- Patient feels much better, tolerating by mouth, no actual vomiting here, no diarrhea, abdomen benign, alleged intact nothing to suggest cauda equina, she knows she must not drive, ride is here to drive her home. She is asking for another shot of pain medication not for pain but "so I can sleep better tonight". I wonder unfortunately we cannot do that. Patient does have home pain medication. I did offer her a Percocet and she declined because she "already has at home". We will see what try to get her safely home. She is in no distress and eager to be discharged. She would like to go home with nausea medication which were provided. Return precautions and follow-up given and understood.  ____________________________________________   FINAL CLINICAL IMPRESSION(S) / ED DIAGNOSES  Final diagnoses:  None      This chart was dictated using voice recognition software.  Despite best efforts to proofread,  errors can occur which can change meaning.      Jeanmarie Plant, MD 01/17/17 1904    Jeanmarie Plant, MD 01/17/17 2001

## 2017-01-17 NOTE — Discharge Instructions (Signed)
Return to the emergency room for new or worrisome symptoms including numbness, weakness, abdominal pain, incontinence of bowel or bladder, fever, or any other new or worrisome symptoms. Follow closely with her primary care doctor. Take all medications as prescribed. You  must not drive home after being in the emergency room tonight or drive later today until tomorrow because you  did receive IV morphine. Do not drive on any sedating medications.

## 2017-01-17 NOTE — ED Notes (Signed)
Pt reports feeling increased weakness and reports feeling as though "I am dehydrated and need fluids" Pt reports her legs feel like "butter" and she feels unable to walk recently. Pt also verbalized her chronic back pain increases at night and she has skipped her pain medication doses today and normally takes 100 mg tramadol three times a day with a percocet to sleep.   Pt also reports she has been a chronic pain medication pt for back pain. Pt reports she believes the medication has injured the lining of her stomach and she is no longer able to eat without pain in stomach.

## 2017-01-17 NOTE — ED Notes (Signed)
signature pad failed. Paper signature attached to paper chart.

## 2017-01-17 NOTE — ED Triage Notes (Signed)
Pt to triage via wheelchair.  Pt has abd pain with diarrhea.  Diarrhea x 5 today.  Decreased appetite.  Pt has back surgery scheduled the end of August    Pt alert

## 2017-01-17 NOTE — ED Notes (Addendum)
Pt reports having nausea after morphine administration. MD made aware.

## 2017-04-27 DIAGNOSIS — M189 Osteoarthritis of first carpometacarpal joint, unspecified: Secondary | ICD-10-CM | POA: Insufficient documentation

## 2017-05-21 ENCOUNTER — Other Ambulatory Visit: Payer: Self-pay | Admitting: Family

## 2017-05-21 DIAGNOSIS — Z1239 Encounter for other screening for malignant neoplasm of breast: Secondary | ICD-10-CM

## 2017-06-23 ENCOUNTER — Emergency Department (HOSPITAL_COMMUNITY): Payer: BLUE CROSS/BLUE SHIELD

## 2017-06-23 ENCOUNTER — Emergency Department (HOSPITAL_COMMUNITY)
Admission: EM | Admit: 2017-06-23 | Discharge: 2017-06-23 | Disposition: A | Discharge status: Home / Self Care | Payer: BLUE CROSS/BLUE SHIELD | Attending: Emergency Medicine | Admitting: Emergency Medicine

## 2017-06-23 ENCOUNTER — Other Ambulatory Visit: Payer: Self-pay

## 2017-06-23 ENCOUNTER — Encounter (HOSPITAL_COMMUNITY): Payer: Self-pay | Admitting: Obstetrics and Gynecology

## 2017-06-23 DIAGNOSIS — S0502XA Injury of conjunctiva and corneal abrasion without foreign body, left eye, initial encounter: Secondary | ICD-10-CM | POA: Diagnosis not present

## 2017-06-23 DIAGNOSIS — R109 Unspecified abdominal pain: Secondary | ICD-10-CM | POA: Diagnosis not present

## 2017-06-23 DIAGNOSIS — Y939 Activity, unspecified: Secondary | ICD-10-CM | POA: Insufficient documentation

## 2017-06-23 DIAGNOSIS — X58XXXA Exposure to other specified factors, initial encounter: Secondary | ICD-10-CM | POA: Diagnosis not present

## 2017-06-23 DIAGNOSIS — E119 Type 2 diabetes mellitus without complications: Secondary | ICD-10-CM | POA: Insufficient documentation

## 2017-06-23 DIAGNOSIS — Y999 Unspecified external cause status: Secondary | ICD-10-CM | POA: Diagnosis not present

## 2017-06-23 DIAGNOSIS — Y929 Unspecified place or not applicable: Secondary | ICD-10-CM | POA: Diagnosis not present

## 2017-06-23 DIAGNOSIS — R1032 Left lower quadrant pain: Secondary | ICD-10-CM | POA: Insufficient documentation

## 2017-06-23 DIAGNOSIS — Z79899 Other long term (current) drug therapy: Secondary | ICD-10-CM | POA: Insufficient documentation

## 2017-06-23 LAB — URINALYSIS, ROUTINE W REFLEX MICROSCOPIC
Bilirubin Urine: NEGATIVE
GLUCOSE, UA: NEGATIVE mg/dL
Ketones, ur: NEGATIVE mg/dL
LEUKOCYTES UA: NEGATIVE
Nitrite: NEGATIVE
PROTEIN: NEGATIVE mg/dL
Specific Gravity, Urine: 1.025 (ref 1.005–1.030)
pH: 5 (ref 5.0–8.0)

## 2017-06-23 LAB — CBC WITH DIFFERENTIAL/PLATELET
BASOS PCT: 0 %
Basophils Absolute: 0 10*3/uL (ref 0.0–0.1)
EOS ABS: 0 10*3/uL (ref 0.0–0.7)
Eosinophils Relative: 0 %
HEMATOCRIT: 38.9 % (ref 36.0–46.0)
Hemoglobin: 13.1 g/dL (ref 12.0–15.0)
Lymphocytes Relative: 14 %
Lymphs Abs: 2.4 10*3/uL (ref 0.7–4.0)
MCH: 28.5 pg (ref 26.0–34.0)
MCHC: 33.7 g/dL (ref 30.0–36.0)
MCV: 84.6 fL (ref 78.0–100.0)
MONOS PCT: 9 %
Monocytes Absolute: 1.6 10*3/uL — ABNORMAL HIGH (ref 0.1–1.0)
NEUTROS ABS: 12.9 10*3/uL — AB (ref 1.7–7.7)
NEUTROS PCT: 77 %
Platelets: 260 10*3/uL (ref 150–400)
RBC: 4.6 MIL/uL (ref 3.87–5.11)
RDW: 14.5 % (ref 11.5–15.5)
WBC: 16.9 10*3/uL — AB (ref 4.0–10.5)

## 2017-06-23 LAB — COMPREHENSIVE METABOLIC PANEL
ALBUMIN: 4.3 g/dL (ref 3.5–5.0)
ALT: 16 U/L (ref 14–54)
ANION GAP: 6 (ref 5–15)
AST: 23 U/L (ref 15–41)
Alkaline Phosphatase: 91 U/L (ref 38–126)
BILIRUBIN TOTAL: 0.6 mg/dL (ref 0.3–1.2)
BUN: 24 mg/dL — AB (ref 6–20)
CALCIUM: 8.9 mg/dL (ref 8.9–10.3)
CO2: 22 mmol/L (ref 22–32)
CREATININE: 0.98 mg/dL (ref 0.44–1.00)
Chloride: 108 mmol/L (ref 101–111)
GFR calc Af Amer: >60 mL/min (ref >60)
GFR calc non Af Amer: >60 mL/min (ref >60)
GLUCOSE: 148 mg/dL — AB (ref 65–99)
Potassium: 4 mmol/L (ref 3.5–5.1)
SODIUM: 136 mmol/L (ref 135–145)
TOTAL PROTEIN: 7.5 g/dL (ref 6.5–8.1)

## 2017-06-23 LAB — PREGNANCY, URINE: Preg Test, Ur: NEGATIVE

## 2017-06-23 MED ORDER — TETRACAINE HCL 0.5 % OP SOLN
1.0000 [drp] | Freq: Once | OPHTHALMIC | Status: AC
  Administered 2017-06-23: 1 [drp] via OPHTHALMIC
  Filled 2017-06-23: qty 4

## 2017-06-23 MED ORDER — FLUORESCEIN SODIUM 1 MG OP STRP
1.0000 | ORAL_STRIP | Freq: Once | OPHTHALMIC | Status: AC
  Administered 2017-06-23: 1 via OPHTHALMIC
  Filled 2017-06-23: qty 1

## 2017-06-23 MED ORDER — MORPHINE SULFATE (PF) 4 MG/ML IV SOLN
4.0000 mg | Freq: Once | INTRAVENOUS | Status: AC
  Administered 2017-06-23: 4 mg via INTRAVENOUS
  Filled 2017-06-23: qty 1

## 2017-06-23 MED ORDER — ERYTHROMYCIN 5 MG/GM OP OINT
TOPICAL_OINTMENT | Freq: Once | OPHTHALMIC | Status: AC
  Administered 2017-06-23: 23:00:00 via OPHTHALMIC
  Filled 2017-06-23: qty 3.5

## 2017-06-23 MED ORDER — HYDROCODONE-ACETAMINOPHEN 5-325 MG PO TABS: 1.0000 | ORAL_TABLET | ORAL | 0 refills | Status: DC | PRN

## 2017-06-23 MED ORDER — ONDANSETRON HCL 4 MG/2ML IJ SOLN
4.0000 mg | Freq: Once | INTRAMUSCULAR | Status: AC
  Administered 2017-06-23: 4 mg via INTRAVENOUS
  Filled 2017-06-23: qty 2

## 2017-06-23 MED ORDER — SODIUM CHLORIDE 0.9 % IV BOLUS (SEPSIS)
1000.0000 mL | Freq: Once | INTRAVENOUS | Status: AC
  Administered 2017-06-23: 1000 mL via INTRAVENOUS

## 2017-06-23 NOTE — ED Notes (Addendum)
Called for Triage x2 with no response

## 2017-06-23 NOTE — ED Provider Notes (Signed)
[facility]-EMERGENCY DEPT Provider Note   CSN: [id number] Arrival date & time: 06/23/17  1616     History   Chief Complaint Chief Complaint  Patient presents with  . Back Pain  . Nausea  . Urinary Frequency    HPI Marie Booker is a 52 y.o. female.  The history is provided by the patient and medical records. No language interpreter was used.    Marie Booker is a 52 y.o. female  with a PMH of DM, restless leg syndrome who presents to the Emergency Department with 2 complaints:  1.  Persistent intermittent left flank pain times 1-2 months.  Seen by PCP mid November where she was treated with Cipro for UTI.  She then followed up with PCP and again was treated with Cipro.  On next follow-up appointment, urinalysis showed no signs of infection but still had blood in the urine.  She was referred to urology.  She called the urology clinic, however they did not have an appointment until March.  Today she felt more nauseous than usual with slight worsening in her pain, prompting her to come to the emergency department for further evaluation.  No emesis.  No fever or chills.  Denies urinary urgency/frequency or dysuria.  She took tramadol with adequate pain relief.  2.  Bilateral eye burning and eye pain. Patient got eyeliner tattooed on today. 5% lidocaine cream was applied topically. She felt as if both her eyes were burning. She told the staff who informed her that this would go away. She feels like pain has persistently gotten worse. Denies visual changes.      Past Medical History:  Diagnosis Date  . Diabetes mellitus without complication (HCC)   . RLS (restless legs syndrome)   . Thyroid disease     There are no active problems to display for this patient.   Past Surgical History:  Procedure Laterality Date  . BACK SURGERY    . LEG SURGERY      OB History    Gravida Para Term Preterm AB Living             3   SAB TAB Ectopic Multiple Live Births               Home Medications    Prior to Admission medications   Medication Sig Start Date End Date Taking? Authorizing Provider  cyclobenzaprine (FLEXERIL) 5 MG tablet Take 1 tablet (5 mg total) by mouth 3 (three) times daily as needed for muscle spasms. 05/15/16   Phineas SemenGoodman, Graydon, MD  fluconazole (DIFLUCAN) 150 MG tablet Take 1 tablet (150 mg total) by mouth once. 09/17/15   Cuthriell, Delorise RoyalsJonathan D, PA-C  HYDROcodone-acetaminophen (NORCO/VICODIN) 5-325 MG tablet Take 1 tablet by mouth every 4 (four) hours as needed. 06/23/17   Ward, Chase PicketJaime Pilcher, PA-C  meloxicam (MOBIC) 15 MG tablet Take 1 tablet (15 mg total) by mouth daily. 09/17/15   Cuthriell, Delorise RoyalsJonathan D, PA-C  methocarbamol (ROBAXIN) 500 MG tablet Take 1 tablet (500 mg total) by mouth 4 (four) times daily. 09/17/15   Cuthriell, Delorise RoyalsJonathan D, PA-C  nystatin cream (MYCOSTATIN) Apply 1 application topically 2 (two) times daily. 09/17/15   Cuthriell, Delorise RoyalsJonathan D, PA-C  ondansetron (ZOFRAN) 4 MG tablet Take 1 tablet (4 mg total) by mouth daily as needed for nausea or vomiting. 01/17/17   Jeanmarie PlantMcShane, James A, MD    Family History No family history on file.  Social History Social History   Tobacco Use  .  Smoking status: Never Smoker  Substance Use Topics  . Alcohol use: No  . Drug use: No     Allergies   Dilaudid [hydromorphone hcl]   Review of Systems Review of Systems  Constitutional: Negative for fever.  Eyes: Positive for photophobia, pain and redness. Negative for visual disturbance.  Gastrointestinal: Positive for nausea. Negative for abdominal pain and vomiting.  Genitourinary: Positive for flank pain and hematuria. Negative for dysuria, frequency, urgency, vaginal bleeding and vaginal discharge.  All other systems reviewed and are negative.    Physical Exam Updated Vital Signs BP 127/70 (BP Location: Left Arm)   Pulse 70   Temp 97.6 F (36.4 C) (Oral)   Resp 19   SpO2 98%   Physical Exam  Constitutional: She is  oriented to person, place, and time. She appears well-developed and well-nourished. No distress.  HENT:  Head: Normocephalic and atraumatic.  Eyes: EOM and lids are normal. Pupils are equal, round, and reactive to light. Lids are everted and swept, no foreign bodies found.    Bilateral eyes erythematous.  Left eye with fairly large fluorescein uptake c/w corneal abrasion. Ph of 7 bilaterally.   Cardiovascular: Normal rate, regular rhythm and normal heart sounds.  No murmur heard. Pulmonary/Chest: Effort normal and breath sounds normal. No respiratory distress.  Abdominal: Soft. She exhibits no distension.  Left flank tenderness with no overlying skin changes. No CVA or abdominal tenderness.  Neurological: She is alert and oriented to person, place, and time.  Skin: Skin is warm and dry.  Nursing note and vitals reviewed.    ED Treatments / Results  Labs (all labs ordered are listed, but only abnormal results are displayed) Labs Reviewed  URINALYSIS, ROUTINE W REFLEX MICROSCOPIC - Abnormal; Notable for the following components:      Result Value   Hgb urine dipstick MODERATE (*)    Bacteria, UA FEW (*)    Squamous Epithelial / LPF 6-30 (*)    All other components within normal limits  CBC WITH DIFFERENTIAL/PLATELET - Abnormal; Notable for the following components:   WBC 16.9 (*)    Neutro Abs 12.9 (*)    Monocytes Absolute 1.6 (*)    All other components within normal limits  COMPREHENSIVE METABOLIC PANEL - Abnormal; Notable for the following components:   Glucose, Bld 148 (*)    BUN 24 (*)    All other components within normal limits  PREGNANCY, URINE    EKG  EKG Interpretation None       Radiology Ct Renal Stone Study  Result Date: 06/23/2017 CLINICAL DATA:  Left flank pain. EXAM: CT ABDOMEN AND PELVIS WITHOUT CONTRAST TECHNIQUE: Multidetector CT imaging of the abdomen and pelvis was performed following the standard protocol without IV contrast. COMPARISON:  CT  09/20/2014 FINDINGS: Lower chest: The lung bases are clear. Hepatobiliary: No focal hepatic lesion allowing for lack contrast. Gallbladder is partially distended. No calcified gallstone, pericholecystic inflammation or biliary dilatation. Pancreas: No ductal dilatation or inflammation. Spleen: Normal in size without focal abnormality. Adrenals/Urinary Tract: No adrenal nodule. No renal stones or hydronephrosis. No perinephric edema. Unenhanced kidneys are unremarkable. Both ureters are decompressed without stones along the course. Urinary bladder is nondistended and not well evaluated. No bladder stone or wall thickening. Pelvic calcifications are phleboliths and unchanged from prior exam. Stomach/Bowel: Stomach physiologically distended with ingested contents. No bowel wall thickening, inflammation or obstruction. Appendix not identified, no pericecal or right lower quadrant inflammation. Moderate colonic stool burden in the proximal colon without  colonic wall thickening or inflammatory change. No significant diverticular disease. Vascular/Lymphatic: Normal caliber abdominal aorta. Small retroperitoneal nodes not enlarged by size criteria. No enlarged abdominal or pelvic nodes. Reproductive: Uterus and bilateral adnexa are unremarkable. Other: Small fat containing umbilical hernia. No free air, free fluid, or intra-abdominal fluid collection. Musculoskeletal: Posterior L4-L5 fusion. There are no acute or suspicious osseous abnormalities. IMPRESSION: 1. No renal stones or obstructive uropathy. 2. No acute abnormality in the abdomen/pelvis or explanation for left flank pain. Electronically Signed   By: Rubye Oaks M.D.   On: 06/23/2017 22:34    Procedures Procedures (including critical care time)  Medications Ordered in ED Medications  tetracaine (PONTOCAINE) 0.5 % ophthalmic solution 1 drop (1 drop Both Eyes Given 06/23/17 1958)  sodium chloride 0.9 % bolus 1,000 mL (0 mLs Intravenous Stopped 06/23/17  2124)  ondansetron (ZOFRAN) injection 4 mg (4 mg Intravenous Given 06/23/17 1958)  fluorescein ophthalmic strip 1 strip (1 strip Both Eyes Given 06/23/17 1958)  morphine 4 MG/ML injection 4 mg (4 mg Intravenous Given 06/23/17 1958)  morphine 4 MG/ML injection 4 mg (4 mg Intravenous Given 06/23/17 2158)  erythromycin ophthalmic ointment ( Both Eyes Given 06/23/17 2316)     Initial Impression / Assessment and Plan / ED Course  I have reviewed the triage vital signs and the nursing notes.  Pertinent labs & imaging results that were available during my care of the patient were reviewed by me and considered in my medical decision making (see chart for details).    Marie Booker is a 52 y.o. female who presents to ED for two complaints:  1. Persistent left flank pain x 1-2 months.  UA with no signs of infection, however does have moderate amount of blood in the urine.  Labs reviewed and notable for leukocytosis of 16.9.  CT renal scan obtained with no acute abnormalities.  Referral to urology.   2.  Bilateral eye burning after a lidocaine cream placed for eyelid tattoo procedure today. Visual acuity reassuring.  Bilateral eyes irrigated with Lequita Halt lenses.  PH normal after irrigation.  Fluorescein uptake to the left eye consistent with a large corneal abrasion. Will treat with erythromycin ointment and have her follow up with her eye doctor on Monday.   Reasons to return to ER discussed. All questions answered.  Patient seen by and discussed with Dr. Silverio Lay who agrees with treatment plan.    Final Clinical Impressions(s) / ED Diagnoses   Final diagnoses:  Abrasion of left cornea, initial encounter  Flank pain    ED Discharge Orders        Ordered    HYDROcodone-acetaminophen (NORCO/VICODIN) 5-325 MG tablet  Every 4 hours PRN     06/23/17 2307       Ward, Chase Picket, PA-C 06/23/17 2353    Charlynne Pander, MD 06/24/17 1505

## 2017-06-23 NOTE — ED Notes (Signed)
Patient transported to CT 

## 2017-06-23 NOTE — ED Notes (Signed)
Bed: [facility] Expected date:  Expected time:  Means of arrival:  Comments: 

## 2017-06-23 NOTE — Discharge Instructions (Signed)
It was my pleasure taking care of you today!   Please follow up with the urologist for ongoing hematuria (blood in the urine).   Use antibiotic ointment to eyes four times daily. Call eye doctor on Monday to schedule follow up appointment as soon as possible. If you do not have an eye doctor, you can call Dr. Dione BoozeGroat, the eye doctor on call today, to schedule a follow up appointment.   Return to ER for new or worsening symptoms, any additional concerns.

## 2017-06-23 NOTE — ED Triage Notes (Signed)
Pt reports she went to get her eyebrows tattooed today and then went shopping and had an attack in her back/flank.  Pt reports she has been on 2 rounds of cipro to treat a UTI and reports being off that for 2 weeks. Pt is also having blurry vision from the gel they used at the salon.  Pt also reports she thinks she may have a kidney stone per her doctor and that her tramadol may have been "masking the pain." Pt reports she has had her urine tested and has had blood in it. Pt denies having a CT scan. Pt states she has had severe nausea and reports pain an 8/10.

## 2017-06-28 DIAGNOSIS — G5602 Carpal tunnel syndrome, left upper limb: Secondary | ICD-10-CM | POA: Insufficient documentation

## 2017-07-11 ENCOUNTER — Other Ambulatory Visit (HOSPITAL_COMMUNITY): Payer: Self-pay | Admitting: Urology

## 2017-07-11 DIAGNOSIS — R109 Unspecified abdominal pain: Secondary | ICD-10-CM

## 2017-07-23 ENCOUNTER — Ambulatory Visit (HOSPITAL_COMMUNITY)
Admission: RE | Admit: 2017-07-23 | Discharge: 2017-07-23 | Disposition: A | Discharge status: Home / Self Care | Payer: BLUE CROSS/BLUE SHIELD | Source: Ambulatory Visit | Attending: Urology | Admitting: Urology

## 2017-07-23 DIAGNOSIS — R109 Unspecified abdominal pain: Secondary | ICD-10-CM

## 2017-07-23 DIAGNOSIS — R1084 Generalized abdominal pain: Secondary | ICD-10-CM | POA: Insufficient documentation

## 2017-07-23 MED ORDER — IOTHALAMATE MEGLUMINE 17.2 % UR SOLN
300.0000 mL | Freq: Once | URETHRAL | Status: AC | PRN
  Administered 2017-07-23: 300 mL via INTRAVESICAL

## 2017-09-24 ENCOUNTER — Ambulatory Visit (INDEPENDENT_AMBULATORY_CARE_PROVIDER_SITE_OTHER): Payer: BLUE CROSS/BLUE SHIELD | Admitting: Obstetrics and Gynecology

## 2017-09-24 ENCOUNTER — Encounter: Payer: Self-pay | Admitting: Obstetrics and Gynecology

## 2017-09-24 ENCOUNTER — Telehealth: Payer: Self-pay | Admitting: Obstetrics and Gynecology

## 2017-09-24 VITALS — BP 137/84 | HR 76 | Ht 70.0 in | Wt 270.8 lb

## 2017-09-24 DIAGNOSIS — Z86018 Personal history of other benign neoplasm: Secondary | ICD-10-CM | POA: Insufficient documentation

## 2017-09-24 DIAGNOSIS — G4733 Obstructive sleep apnea (adult) (pediatric): Secondary | ICD-10-CM | POA: Insufficient documentation

## 2017-09-24 DIAGNOSIS — B372 Candidiasis of skin and nail: Secondary | ICD-10-CM | POA: Diagnosis not present

## 2017-09-24 DIAGNOSIS — E039 Hypothyroidism, unspecified: Secondary | ICD-10-CM | POA: Insufficient documentation

## 2017-09-24 DIAGNOSIS — N941 Unspecified dyspareunia: Secondary | ICD-10-CM | POA: Diagnosis not present

## 2017-09-24 DIAGNOSIS — N952 Postmenopausal atrophic vaginitis: Secondary | ICD-10-CM

## 2017-09-24 DIAGNOSIS — R310 Gross hematuria: Secondary | ICD-10-CM

## 2017-09-24 DIAGNOSIS — N761 Subacute and chronic vaginitis: Secondary | ICD-10-CM

## 2017-09-24 DIAGNOSIS — N951 Menopausal and female climacteric states: Secondary | ICD-10-CM

## 2017-09-24 DIAGNOSIS — E669 Obesity, unspecified: Secondary | ICD-10-CM | POA: Insufficient documentation

## 2017-09-24 DIAGNOSIS — G2581 Restless legs syndrome: Secondary | ICD-10-CM | POA: Insufficient documentation

## 2017-09-24 MED ORDER — NYSTATIN-TRIAMCINOLONE 100000-0.1 UNIT/GM-% EX OINT: 1.0000 "application " | TOPICAL_OINTMENT | Freq: Two times a day (BID) | CUTANEOUS | 0 refills | Status: DC

## 2017-09-24 MED ORDER — SECNIDAZOLE 2 G PO PACK: 1.0000 | PACK | Freq: Once | ORAL | 0 refills | Status: AC

## 2017-09-24 MED ORDER — PAROXETINE MESYLATE 7.5 MG PO CAPS: 1.0000 | ORAL_CAPSULE | Freq: Every day | ORAL | 3 refills | Status: DC

## 2017-09-24 NOTE — Telephone Encounter (Signed)
Pt is called back and informed that AC replaced her Solosec 2gm with Flagyl 500mg  and Brisdelle 7.5mg  with Paxil 10 mg. Pt is aware that the new prescriptions have been called into [facility].

## 2017-09-24 NOTE — Telephone Encounter (Signed)
The patient called and stated that she would like to speak with Dr. Valentino Saxonherry or her nurse in regards to her two medications not being covered by her insurance. Please advise.

## 2017-09-24 NOTE — Progress Notes (Signed)
Pt is having night sweats and have a rash under the folds of her skin for 2 weeks. Pain in left side and blood in urine and burning in the vaginal area when urinating. Painful sexual intercourse over 6 months.

## 2017-09-24 NOTE — Progress Notes (Signed)
GYNECOLOGY PROGRESS NOTE  Subjective:    Patient ID: Marie Booker, female    DOB: 1966-02-06, 52 y.o.   MRN: [id number]  HPI  Patient is a 52 y.o. female, postmenopausal x 1 year, who initially presented to establish care and for annual exam, however has multiple complaints so will return for her annual exam at a different visit.  Her complaints include:   1. Patient complains of severe night sweats, wakes up drenched most nights of the week.  2. Rash under skin folds (breast, abdominal fold, groin) x 2 weeks.  Has been using Nystatin powder without much improvement.  3. Pain in left side, and blood in urine over past month or 2.  Has undergone workup for kidney stones, and has seen a Urologist, all with negative workup so far. Also with burning in the vaginal area when urinating.  Has next appointment with Urologist in 2 weeks.  4. Painful intercourse over the past 6 months.  Patient notes that she was diagnosed with a yeast infection twice and has been treated with Diflucan, but note no improvement in her symptoms.    Past Medical History:  Diagnosis Date  . Diabetes mellitus without complication (HCC)   . RLS (restless legs syndrome)   . Thyroid disease     History reviewed. No pertinent family history.   Past Surgical History:  Procedure Laterality Date  . BACK SURGERY    . LEG SURGERY      Social History   Socioeconomic History  . Marital status: Divorced    Spouse name: Not on file  . Number of children: Not on file  . Years of education: Not on file  . Highest education level: Not on file  Occupational History  . Not on file  Social Needs  . Financial resource strain: Not on file  . Food insecurity:    Worry: Not on file    Inability: Not on file  . Transportation needs:    Medical: Not on file    Non-medical: Not on file  Tobacco Use  . Smoking status: Never Smoker  . Smokeless tobacco: Never Used  Substance and Sexual Activity  . Alcohol use: No  .  Drug use: No  . Sexual activity: Yes    Birth control/protection: Post-menopausal  Lifestyle  . Physical activity:    Days per week: Not on file    Minutes per session: Not on file  . Stress: Not on file  Relationships  . Social connections:    Talks on phone: Not on file    Gets together: Not on file    Attends religious service: Not on file    Active member of club or organization: Not on file    Attends meetings of clubs or organizations: Not on file    Relationship status: Not on file  . Intimate partner violence:    Fear of current or ex partner: Not on file    Emotionally abused: Not on file    Physically abused: Not on file    Forced sexual activity: Not on file  Other Topics Concern  . Not on file  Social History Narrative  . Not on file    Current Outpatient Medications on File Prior to Visit  Medication Sig Dispense Refill  . rOPINIRole (REQUIP) 2 MG tablet Take 2 mg by mouth at bedtime.    . sertraline (ZOLOFT) 100 MG tablet Take 100 mg by mouth daily.    Marland Kitchen. thyroid (ARMOUR)  180 MG tablet Take 180 mg by mouth daily.    . traMADol (ULTRAM) 50 MG tablet Take by mouth every 6 (six) hours as needed.     No current facility-administered medications on file prior to visit.     Allergies  Allergen Reactions  . Dilaudid [Hydromorphone Hcl] Itching     Review of Systems Pertinent items noted in HPI and remainder of comprehensive ROS otherwise negative.   Objective:   Blood pressure 137/84, pulse 76, height 5\' 10"  (1.778 m), weight 270 lb 12.8 oz (122.8 kg). Body mass index is 38.86 kg/m.  General appearance: alert, no distress and moderately obese  Skin: moisture noted underneath both breasts with rash/darkening of the skin beneath the breasts and along the abdominal folds and groin regions.  Abdomen: soft, non-tender; bowel sounds normal; no masses,  no organomegaly Pelvic: external genitalia normal, rectovaginal septum normal.  Vagina with scant thin white  discharge, mild vaginal atrophy.  Cervix normal appearing, no lesions and no motion tenderness.  Uterus mobile, nontender, normal shape and size.  Adnexae non-palpable, nontender bilaterally.  Extremities: extremities normal, atraumatic, no cyanosis or edema Neurologic: Grossly normal   Microscopic wet-mount exam shows clue cells and bacteria present.  No hyphae or budding yeast present. KOH done.    Assessment:   Menopausal vasomotor symptoms Hematuria Skin candidiasis Bacterial vaginosis Dyspareunia  Vaginal atrophy  Plan:   1. Menopausal vasomotor syndrome - Patient with bothersome menopausal vasomotor symptoms. Discussed lifestyle interventions such as wearing light clothing, remaining in cool environments, having fan/air conditioner in the room, avoiding hot beverages etc.  Discussed using hormone therapy and concerns about increased risk of heart disease, cerebrovascular disease, thromboembolic disease,  and breast cancer.  Also discussed other medical options such as Paxil, Effexor, Brisdelle, Clonidine,  or Neurontin.   Also discussed alternative therapies such as herbal remedies but cautioned that most of the products contained phytoestrogens (plant estrogens) in unregulated amounts which can have the same effects on the body as the pharmaceutical estrogen preparations.  Patient desires to try non-hormonal method.  Will prescribe Brisdelle.  2. Hematuria - patient has a f/u appointment with Urology.  Has had negative workup for stones and UTI.  Will likely need cystoscopy. Has appt with Urology again in 2 weeks.  3. Skin candidiasis - currently using Nystatin powder, but it is not helping. Will prescribe Nystatin-Triamcinolone ointment for rash. To use until rash clears, then can resume use of Nystatin powder.  4. Bacterial vaginosis - prescribed Solasec 1 time dosing. To take a Diflucan 2-3 days post-treatment (can continue to help with skin candidiasis and prevent yeast infection  from antibiotic use).  5. Dyspareunia - likely due to vaginal atrophy - will wait until vaginal infection clears to discuss further treatment, as the vaginitis could also be the cause of her dyspareunia. Vaginal atrophy was mild on exam. If still symptomatic after treatment, can consider use of local hormone therapy (previously declined systemic use due to family h/o breast cancer in great aunts).   F/u in 2-3 weeks for annual exam, and can reassess symptoms.    A total of 30 minutes were spent face-to-face with the patient during the encounter with greater than 50% dealing with counseling and coordination of care.   Hildred Laser, MD [facility]

## 2017-09-24 NOTE — Telephone Encounter (Signed)
Pt was called back. Pt didn't answer the phone LM to call the office informed her that I had also received rejection notes from the pharmacy and we had a solosec discount card that she could use if she wanted to come by and pick it up. Advised pt to call the office as soon as she could.

## 2017-10-24 ENCOUNTER — Other Ambulatory Visit: Payer: Self-pay | Admitting: Specialist

## 2017-10-25 ENCOUNTER — Encounter: Payer: BLUE CROSS/BLUE SHIELD | Admitting: Obstetrics and Gynecology

## 2017-10-31 ENCOUNTER — Encounter: Payer: Self-pay | Admitting: *Deleted

## 2017-10-31 ENCOUNTER — Other Ambulatory Visit: Payer: Self-pay

## 2017-10-31 ENCOUNTER — Encounter
Admission: RE | Admit: 2017-10-31 | Discharge: 2017-10-31 | Disposition: A | Discharge status: Home / Self Care | Payer: BLUE CROSS/BLUE SHIELD | Source: Ambulatory Visit | Attending: Specialist | Admitting: Specialist

## 2017-10-31 HISTORY — DX: Unspecified osteoarthritis, unspecified site: M19.90

## 2017-10-31 HISTORY — DX: Unspecified asthma, uncomplicated: J45.909

## 2017-10-31 HISTORY — DX: Sleep apnea, unspecified: G47.30

## 2017-10-31 HISTORY — DX: Prediabetes: R73.03

## 2017-10-31 HISTORY — DX: Anemia, unspecified: D64.9

## 2017-10-31 HISTORY — DX: Family history of other specified conditions: Z84.89

## 2017-10-31 HISTORY — DX: Hypothyroidism, unspecified: E03.9

## 2017-10-31 NOTE — Patient Instructions (Signed)
Your procedure is scheduled on: 11-07-17 Report to Same Day Surgery 2nd floor medical mall [facility] Entrance-take elevator on left to 2nd floor.  Check in with surgery information desk.) To find out your arrival time please call [phone] between 1PM - 3PM on 11-06-17  Remember: Instructions that are not followed completely may result in serious medical risk, up to and including death, or upon the discretion of your surgeon and anesthesiologist your surgery may need to be rescheduled.    _x___ 1. Do not eat food after midnight the night before your procedure. NO GUM OR CANDY AFTER MIDNIGHT.  You may drink clear liquids up to 2 hours before you are scheduled to arrive at the hospital for your procedure.  Do not drink clear liquids within 2 hours of your scheduled arrival to the hospital.  Clear liquids include  --Water or Apple juice without pulp  --Clear carbohydrate beverage such as ClearFast or Gatorade  --Black Coffee or Clear Tea (No milk, no creamers, do not add anything to the coffee or Tea     __x__ 2. No Alcohol for 24 hours before or after surgery.   __x__3. No Smoking or e-cigarettes for 24 prior to surgery.  Do not use any chewable tobacco products for at least 6 hour prior to surgery   ____  4. Bring all medications with you on the day of surgery if instructed.    __x__ 5. Notify your doctor if there is any change in your medical condition     (cold, fever, infections).    x___6. On the morning of surgery brush your teeth with toothpaste and water.  You may rinse your mouth with mouth wash if you wish.  Do not swallow any toothpaste or mouthwash.   Do not wear jewelry, make-up, hairpins, clips or nail polish.  Do not wear lotions, powders, or perfumes. You may wear deodorant.  Do not shave 48 hours prior to surgery. Men may shave face and neck.  Do not bring valuables to the hospital.    [facility] is not responsible for any belongings or valuables.    Contacts, dentures or bridgework may not be worn into surgery.  Leave your suitcase in the car. After surgery it may be brought to your room.  For patients admitted to the hospital, discharge time is determined by your treatment team.  _  Patients discharged the day of surgery will not be allowed to drive home.  You will need someone to drive you home and stay with you the night of your procedure.     _x___ TAKE THE FOLLOWING MEDICATION THE MORNING OF SURGERY. These include:  1. ARMOUR THYROID  2.  3.  4.  5.  6.  ____Fleets enema or Magnesium Citrate as directed.   ____ Use CHG Soap or sage wipes as directed on instruction sheet   _X___ Use inhalers on the day of surgery and bring to hospital day of surgery-USE YOUR ALBUTEROL INHALER AM OF SURGERY AND BRING TO THE HOSPITAL  ____ Stop Metformin and Janumet 2 days prior to surgery.    ____ Take 1/2 of usual insulin dose the night before surgery and none on the morning surgery.   ____ Follow recommendations from Cardiologist, Pulmonologist or PCP regarding stopping Aspirin, Coumadin, Plavix ,Eliquis, Effient, or Pradaxa, and Pletal.  X____Stop Anti-inflammatories such as Advil, Aleve, Ibuprofen, Motrin, Naproxen, Naprosyn, Goodies powders or aspirin products. OK to take TRAMADOL IF NEEDED   ____ Stop supplements until after surgery.  ____ Bring C-Pap to the hospital.

## 2017-10-31 NOTE — Pre-Procedure Instructions (Signed)
MEDICAL CLEARANCE ON CHART FROM DR Rolm Bookbinder RISK

## 2017-11-06 MED ORDER — CEFAZOLIN SODIUM-DEXTROSE 2-4 GM/100ML-% IV SOLN
2.0000 g | INTRAVENOUS | Status: AC
  Administered 2017-11-07: 2 g via INTRAVENOUS

## 2017-11-06 MED ORDER — CLINDAMYCIN PHOSPHATE 600 MG/50ML IV SOLN
600.0000 mg | INTRAVENOUS | Status: AC
  Administered 2017-11-07: 600 mg via INTRAVENOUS

## 2017-11-07 ENCOUNTER — Encounter
Admission: RE | Disposition: A | Discharge status: Home / Self Care | Payer: Self-pay | Source: Ambulatory Visit | Attending: Specialist

## 2017-11-07 ENCOUNTER — Encounter: Payer: Self-pay | Admitting: *Deleted

## 2017-11-07 ENCOUNTER — Ambulatory Visit: Payer: BLUE CROSS/BLUE SHIELD | Admitting: Certified Registered Nurse Anesthetist

## 2017-11-07 ENCOUNTER — Ambulatory Visit
Admission: RE | Admit: 2017-11-07 | Discharge: 2017-11-07 | Disposition: A | Discharge status: Home / Self Care | Payer: BLUE CROSS/BLUE SHIELD | Source: Ambulatory Visit | Attending: Specialist | Admitting: Specialist

## 2017-11-07 ENCOUNTER — Other Ambulatory Visit: Payer: Self-pay

## 2017-11-07 DIAGNOSIS — F419 Anxiety disorder, unspecified: Secondary | ICD-10-CM | POA: Insufficient documentation

## 2017-11-07 DIAGNOSIS — J45909 Unspecified asthma, uncomplicated: Secondary | ICD-10-CM | POA: Diagnosis not present

## 2017-11-07 DIAGNOSIS — G5602 Carpal tunnel syndrome, left upper limb: Secondary | ICD-10-CM | POA: Diagnosis present

## 2017-11-07 DIAGNOSIS — Z79899 Other long term (current) drug therapy: Secondary | ICD-10-CM | POA: Insufficient documentation

## 2017-11-07 DIAGNOSIS — M1990 Unspecified osteoarthritis, unspecified site: Secondary | ICD-10-CM | POA: Diagnosis not present

## 2017-11-07 DIAGNOSIS — Z6838 Body mass index (BMI) 38.0-38.9, adult: Secondary | ICD-10-CM | POA: Diagnosis not present

## 2017-11-07 DIAGNOSIS — F329 Major depressive disorder, single episode, unspecified: Secondary | ICD-10-CM | POA: Diagnosis not present

## 2017-11-07 DIAGNOSIS — G2581 Restless legs syndrome: Secondary | ICD-10-CM | POA: Insufficient documentation

## 2017-11-07 DIAGNOSIS — R7303 Prediabetes: Secondary | ICD-10-CM | POA: Diagnosis not present

## 2017-11-07 DIAGNOSIS — Z885 Allergy status to narcotic agent status: Secondary | ICD-10-CM | POA: Diagnosis not present

## 2017-11-07 DIAGNOSIS — E039 Hypothyroidism, unspecified: Secondary | ICD-10-CM | POA: Diagnosis not present

## 2017-11-07 DIAGNOSIS — G4730 Sleep apnea, unspecified: Secondary | ICD-10-CM | POA: Diagnosis not present

## 2017-11-07 HISTORY — PX: CARPAL TUNNEL RELEASE: SHX101

## 2017-11-07 LAB — GLUCOSE, CAPILLARY
GLUCOSE-CAPILLARY: 98 mg/dL (ref 65–99)
GLUCOSE-CAPILLARY: 99 mg/dL (ref 65–99)

## 2017-11-07 SURGERY — CARPAL TUNNEL RELEASE
Anesthesia: General | Site: Wrist | Laterality: Left | Wound class: "Clean "

## 2017-11-07 MED ORDER — FENTANYL CITRATE (PF) 100 MCG/2ML IJ SOLN
INTRAMUSCULAR | Status: DC | PRN
  Administered 2017-11-07 (×2): 50 ug via INTRAVENOUS

## 2017-11-07 MED ORDER — KETOROLAC TROMETHAMINE 30 MG/ML IJ SOLN
INTRAMUSCULAR | Status: DC | PRN
  Administered 2017-11-07: 30 mg via INTRAVENOUS

## 2017-11-07 MED ORDER — PROPOFOL 10 MG/ML IV BOLUS
INTRAVENOUS | Status: DC | PRN
  Administered 2017-11-07 (×2): 20 mg via INTRAVENOUS
  Administered 2017-11-07: 10 mg via INTRAVENOUS

## 2017-11-07 MED ORDER — ONDANSETRON HCL 4 MG/2ML IJ SOLN
INTRAMUSCULAR | Status: DC | PRN
  Administered 2017-11-07: 4 mg via INTRAVENOUS

## 2017-11-07 MED ORDER — BUPIVACAINE HCL (PF) 0.5 % IJ SOLN
INTRAMUSCULAR | Status: DC | PRN
  Administered 2017-11-07: 16 mL

## 2017-11-07 MED ORDER — FENTANYL CITRATE (PF) 100 MCG/2ML IJ SOLN: 25.0000 ug | INTRAMUSCULAR | Status: DC | PRN

## 2017-11-07 MED ORDER — DEXMEDETOMIDINE HCL 200 MCG/2ML IV SOLN
INTRAVENOUS | Status: DC | PRN
  Administered 2017-11-07: 16 ug via INTRAVENOUS

## 2017-11-07 MED ORDER — MIDAZOLAM HCL 2 MG/2ML IJ SOLN
INTRAMUSCULAR | Status: AC
  Filled 2017-11-07: qty 2

## 2017-11-07 MED ORDER — HYDROCODONE-ACETAMINOPHEN 5-325 MG PO TABS: 1.0000 | ORAL_TABLET | Freq: Four times a day (QID) | ORAL | 0 refills | Status: DC | PRN

## 2017-11-07 MED ORDER — LIDOCAINE HCL (PF) 0.5 % IJ SOLN
INTRAMUSCULAR | Status: AC
  Filled 2017-11-07: qty 50

## 2017-11-07 MED ORDER — PROPOFOL 500 MG/50ML IV EMUL
INTRAVENOUS | Status: DC | PRN
  Administered 2017-11-07: 85 ug/kg/min via INTRAVENOUS

## 2017-11-07 MED ORDER — MELOXICAM 7.5 MG PO TABS
15.0000 mg | ORAL_TABLET | ORAL | Status: AC
  Administered 2017-11-07: 15 mg via ORAL

## 2017-11-07 MED ORDER — ONDANSETRON HCL 4 MG/2ML IJ SOLN: 4.0000 mg | Freq: Once | INTRAMUSCULAR | Status: DC | PRN

## 2017-11-07 MED ORDER — LIDOCAINE HCL (PF) 0.5 % IJ SOLN
INTRAMUSCULAR | Status: DC | PRN
  Administered 2017-11-07: 300 mg via INTRAVENOUS

## 2017-11-07 MED ORDER — BUPIVACAINE HCL (PF) 0.5 % IJ SOLN
INTRAMUSCULAR | Status: AC
  Filled 2017-11-07: qty 30

## 2017-11-07 MED ORDER — CHLORHEXIDINE GLUCONATE CLOTH 2 % EX PADS: 6.0000 | MEDICATED_PAD | Freq: Once | CUTANEOUS | Status: DC

## 2017-11-07 MED ORDER — GABAPENTIN 300 MG PO CAPS
300.0000 mg | ORAL_CAPSULE | ORAL | Status: AC
  Administered 2017-11-07: 300 mg via ORAL

## 2017-11-07 MED ORDER — FENTANYL CITRATE (PF) 100 MCG/2ML IJ SOLN
INTRAMUSCULAR | Status: AC
  Filled 2017-11-07: qty 2

## 2017-11-07 MED ORDER — GABAPENTIN 400 MG PO CAPS: 400.0000 mg | ORAL_CAPSULE | Freq: Two times a day (BID) | ORAL | 3 refills | Status: DC

## 2017-11-07 MED ORDER — FAMOTIDINE 20 MG PO TABS
20.0000 mg | ORAL_TABLET | Freq: Once | ORAL | Status: AC
  Administered 2017-11-07: 20 mg via ORAL

## 2017-11-07 MED ORDER — MIDAZOLAM HCL 2 MG/2ML IJ SOLN
INTRAMUSCULAR | Status: DC | PRN
  Administered 2017-11-07: 2 mg via INTRAVENOUS

## 2017-11-07 MED ORDER — MELOXICAM 15 MG PO TABS: 15.0000 mg | ORAL_TABLET | Freq: Every day | ORAL | 2 refills | Status: AC

## 2017-11-07 MED ORDER — LACTATED RINGERS IV SOLN
INTRAVENOUS | Status: DC
  Administered 2017-11-07 (×2): via INTRAVENOUS

## 2017-11-07 SURGICAL SUPPLY — 32 items
BLADE SURG MINI STRL (BLADE) ×3 IMPLANT
BNDG ESMARK 4X12 TAN STRL LF (GAUZE/BANDAGES/DRESSINGS) ×5 IMPLANT
CANISTER SUCT 1200ML W/VALVE (MISCELLANEOUS) ×3 IMPLANT
CHLORAPREP W/TINT 26ML (MISCELLANEOUS) ×3 IMPLANT
CUFF DUAL TOURNIQUET 18IN DISP (TOURNIQUET CUFF) ×2 IMPLANT
CUFF DUAL TOURNIQUET 24IN DISP (TOURNIQUET CUFF) IMPLANT
CUFF TOURN 18 STER (MISCELLANEOUS) IMPLANT
DRSG GAUZE FLUFF 36X18 (GAUZE/BANDAGES/DRESSINGS) ×3 IMPLANT
ELECT REM PT RETURN 9FT ADLT (ELECTROSURGICAL) ×3
ELECTRODE REM PT RTRN 9FT ADLT (ELECTROSURGICAL) ×1 IMPLANT
GAUZE PETRO XEROFOAM 1X8 (MISCELLANEOUS) ×3 IMPLANT
GLOVE BIO SURGEON STRL SZ8 (GLOVE) ×3 IMPLANT
GLOVE BIOGEL PI IND STRL 7.0 (GLOVE) IMPLANT
GLOVE BIOGEL PI INDICATOR 7.0 (GLOVE) ×8
GOWN STRL REUS W/ TWL LRG LVL3 (GOWN DISPOSABLE) ×1 IMPLANT
GOWN STRL REUS W/TWL LRG LVL3 (GOWN DISPOSABLE) ×6
GOWN STRL REUS W/TWL LRG LVL4 (GOWN DISPOSABLE) ×3 IMPLANT
KIT TURNOVER KIT A (KITS) ×3 IMPLANT
NS IRRIG 500ML POUR BTL (IV SOLUTION) ×3 IMPLANT
PACK EXTREMITY ARMC (MISCELLANEOUS) ×3 IMPLANT
PAD PREP 24X41 OB/GYN DISP (PERSONAL CARE ITEMS) ×3 IMPLANT
PADDING CAST 4IN STRL (MISCELLANEOUS) ×2
PADDING CAST BLEND 4X4 STRL (MISCELLANEOUS) ×1 IMPLANT
SPLINT CAST 1 STEP 3X12 (MISCELLANEOUS) ×3 IMPLANT
STOCKINETTE 48X4 2 PLY STRL (GAUZE/BANDAGES/DRESSINGS) ×1 IMPLANT
STOCKINETTE BIAS CUT 3 980034 (MISCELLANEOUS) ×2 IMPLANT
STOCKINETTE BIAS CUT 4 980044 (GAUZE/BANDAGES/DRESSINGS) ×3 IMPLANT
STOCKINETTE STRL 4IN 9604848 (GAUZE/BANDAGES/DRESSINGS) ×3 IMPLANT
SUT ETHILON 4-0 (SUTURE)
SUT ETHILON 4-0 FS2 18XMFL BLK (SUTURE)
SUT ETHILON 5-0 FS-2 18 BLK (SUTURE) ×3 IMPLANT
SUTURE ETHLN 4-0 FS2 18XMF BLK (SUTURE) ×1 IMPLANT

## 2017-11-07 NOTE — Op Note (Signed)
11/07/2017  11:53 AM  PATIENT:  Marie Booker    PRE-OPERATIVE DIAGNOSIS: LEFT CARPAL TUNNEL SYNDROME  POST-OPERATIVE DIAGNOSIS: LEFT CARPAL TUNNEL SYNDROME  PROCEDURE:  LEFT CARPAL TUNNEL RELEASE  SURGEON: Valinda Hoar, MD  TOURNIQUET TIME: 36  MIN   ANESTHESIA:   IV Regional  PREOPERATIVE INDICATIONS:  Marie Booker is a  52 y.o. female with a diagnosis of left carpal tunnel syndrome who failed conservative measures and elected for surgical management.    The risks benefits and alternatives were discussed with the patient preoperatively including but not limited to the risks of infection, bleeding, nerve injury, incomplete relief of symptoms, pillar pain, cardiopulmonary complications, the need for revision surgery, among others, and the patient was willing to proceed.  OPERATIVE FINDINGS: Thickened volar ligament and nerve compression.  OPERATIVE PROCEDURE: The patient is brought to the operating room placed in the supine position.IV regional anesthesia was administered. The left upper extremity was prepped and draped in usual sterile fashion. Time out was performed. The arm was elevated and exsanguinated and the tourniquet was inflated. Incision was made in line with the radial border of the ring finger. The carpal tunnel transverse fascia was identified, cleaned, and incised sharply. The common sensory branches were visualized along with the superficial palmar arch and protected.  The median nerve was protected below. A Kelly clamp was  placed underneath the transverse carpal ligament, protecting the nerve. I released the ligament completely, and then released the proximal distal volar forearm fascia. The nerve was identified, and visualized, and protected throughout the case. The motor branch was intact upon inspection. No masses or abnormalities were identified in the ulnar bursa.  The wounds were irrigated copiously and the skin closed with nylon. The wound was injected with 1/2 %  marcaine followed by a sterile dressing and volar splint. Tourniquet was deflated with good return of blood flow to all fingers. Sponge and needle counts were correct.  The patient tolerated this well, with no complications. The patient was awakened and taken to recovery in good condition.

## 2017-11-07 NOTE — Anesthesia Preprocedure Evaluation (Addendum)
Anesthesia Evaluation  Patient identified by MRN, date of birth, ID band Patient awake    Reviewed: Allergy & Precautions, NPO status , Patient's Chart, lab work & pertinent test results  Airway Mallampati: III  TM Distance: <3 FB     Dental  (+) Teeth Intact   Pulmonary asthma , sleep apnea ,    Pulmonary exam normal        Cardiovascular negative cardio ROS Normal cardiovascular exam     Neuro/Psych PSYCHIATRIC DISORDERS Anxiety Depression  Neuromuscular disease    GI/Hepatic negative GI ROS, Neg liver ROS,   Endo/Other  Hypothyroidism Morbid obesity  Renal/GU negative Renal ROS  negative genitourinary   Musculoskeletal  (+) Arthritis ,   Abdominal (+) + obese,   Peds negative pediatric ROS (+)  Hematology  (+) anemia ,   Anesthesia Other Findings Past Medical History: No date: Anemia     Comment:  iron infusion 4 years ago due to heavy periods No date: Arthritis No date: Asthma No date: Family history of adverse reaction to anesthesia     Comment:  mom-hard time waking up No date: Hypothyroidism No date: Pre-diabetes No date: RLS (restless legs syndrome) No date: Sleep apnea No date: Thyroid disease  Reproductive/Obstetrics                            Anesthesia Physical Anesthesia Plan  ASA: II  Anesthesia Plan: Bier Block and Bier Block-LIDOCAINE ONLY   Post-op Pain Management:    Induction: Intravenous  PONV Risk Score and Plan: TIVA  Airway Management Planned:   Additional Equipment:   Intra-op Plan:   Post-operative Plan:   Informed Consent: I have reviewed the patients History and Physical, chart, labs and discussed the procedure including the risks, benefits and alternatives for the proposed anesthesia with the patient or authorized representative who has indicated his/her understanding and acceptance.   Dental advisory given  Plan Discussed with: CRNA and  Surgeon  Anesthesia Plan Comments: (Patient prefers bier block over general anesthesia.  Patient understands risks of procedure and wishes to proceed. )       Anesthesia Quick Evaluation

## 2017-11-07 NOTE — Discharge Instructions (Signed)

## 2017-11-07 NOTE — Anesthesia Postprocedure Evaluation (Signed)
Anesthesia Post Note  Patient: Marie Booker  Procedure(s) Performed: CARPAL TUNNEL RELEASE (Left Wrist)  Patient location during evaluation: PACU Anesthesia Type: Bier Block Level of consciousness: awake and alert and oriented Pain management: pain level controlled Vital Signs Assessment: post-procedure vital signs reviewed and stable Respiratory status: spontaneous breathing Cardiovascular status: blood pressure returned to baseline Anesthetic complications: no     Last Vitals:  Vitals:   11/07/17 1305 11/07/17 1322  BP: (!) 107/57 (!) 141/77  Pulse: (!) 57 64  Resp: 14 16  Temp: (!) 36.1 C (!) 36.2 C  SpO2: 100% 99%    Last Pain:  Vitals:   11/07/17 1322  TempSrc: Temporal  PainSc: 0-No pain                 Jovante Hammitt

## 2017-11-07 NOTE — Anesthesia Post-op Follow-up Note (Signed)
Anesthesia QCDR form completed.        

## 2017-11-07 NOTE — H&P (Signed)
THE PATIENT WAS SEEN PRIOR TO SURGERY TODAY.  HISTORY, ALLERGIES, HOME MEDICATIONS AND OPERATIVE PROCEDURE WERE REVIEWED. RISKS AND BENEFITS OF SURGERY DISCUSSED WITH PATIENT AGAIN.  NO CHANGES FROM INITIAL HISTORY AND PHYSICAL NOTED.    

## 2017-11-07 NOTE — Transfer of Care (Signed)
Immediate Anesthesia Transfer of Care Note  Patient: Marie Booker  Procedure(s) Performed: CARPAL TUNNEL RELEASE (Left Wrist)  Patient Location: PACU  Anesthesia Type:Bier block  Level of Consciousness: awake, alert  and oriented  Airway & Oxygen Therapy: Patient Spontanous Breathing and Patient connected to face mask oxygen  Post-op Assessment: Report given to RN and Post -op Vital signs reviewed and stable  Post vital signs: stable  Last Vitals:  Vitals Value Taken Time  BP    Temp    Pulse    Resp    SpO2      Last Pain:  Vitals:   11/07/17 0903  TempSrc: Other (Comment)  PainSc: 4          Complications: No apparent anesthesia complications

## 2017-11-13 ENCOUNTER — Emergency Department
Admission: EM | Admit: 2017-11-13 | Discharge: 2017-11-13 | Disposition: A | Discharge status: Home / Self Care | Payer: BLUE CROSS/BLUE SHIELD | Attending: Emergency Medicine | Admitting: Emergency Medicine

## 2017-11-13 ENCOUNTER — Emergency Department: Payer: BLUE CROSS/BLUE SHIELD

## 2017-11-13 ENCOUNTER — Encounter: Payer: Self-pay | Admitting: Emergency Medicine

## 2017-11-13 DIAGNOSIS — J45909 Unspecified asthma, uncomplicated: Secondary | ICD-10-CM | POA: Insufficient documentation

## 2017-11-13 DIAGNOSIS — Z79899 Other long term (current) drug therapy: Secondary | ICD-10-CM | POA: Diagnosis not present

## 2017-11-13 DIAGNOSIS — R1032 Left lower quadrant pain: Secondary | ICD-10-CM | POA: Insufficient documentation

## 2017-11-13 DIAGNOSIS — F419 Anxiety disorder, unspecified: Secondary | ICD-10-CM | POA: Insufficient documentation

## 2017-11-13 DIAGNOSIS — R109 Unspecified abdominal pain: Secondary | ICD-10-CM

## 2017-11-13 DIAGNOSIS — E039 Hypothyroidism, unspecified: Secondary | ICD-10-CM | POA: Insufficient documentation

## 2017-11-13 LAB — BASIC METABOLIC PANEL
Anion gap: 8 (ref 5–15)
BUN: 17 mg/dL (ref 6–20)
CO2: 24 mmol/L (ref 22–32)
Calcium: 9.2 mg/dL (ref 8.9–10.3)
Chloride: 106 mmol/L (ref 101–111)
Creatinine, Ser: 0.94 mg/dL (ref 0.44–1.00)
GFR calc Af Amer: >60 mL/min (ref >60)
GLUCOSE: 92 mg/dL (ref 65–99)
POTASSIUM: 4.3 mmol/L (ref 3.5–5.1)
Sodium: 138 mmol/L (ref 135–145)

## 2017-11-13 LAB — URINALYSIS, COMPLETE (UACMP) WITH MICROSCOPIC
BACTERIA UA: NONE SEEN
Bilirubin Urine: NEGATIVE
GLUCOSE, UA: NEGATIVE mg/dL
KETONES UR: NEGATIVE mg/dL
NITRITE: NEGATIVE
PROTEIN: NEGATIVE mg/dL
Specific Gravity, Urine: 1.021 (ref 1.005–1.030)
pH: 5 (ref 5.0–8.0)

## 2017-11-13 LAB — CBC
HEMATOCRIT: 39 % (ref 35.0–47.0)
Hemoglobin: 13.4 g/dL (ref 12.0–16.0)
MCH: 29.2 pg (ref 26.0–34.0)
MCHC: 34.4 g/dL (ref 32.0–36.0)
MCV: 84.9 fL (ref 80.0–100.0)
Platelets: 244 10*3/uL (ref 150–440)
RBC: 4.6 MIL/uL (ref 3.80–5.20)
RDW: 13 % (ref 11.5–14.5)
WBC: 5.8 10*3/uL (ref 3.6–11.0)

## 2017-11-13 MED ORDER — TRAMADOL HCL 50 MG PO TABS
50.0000 mg | ORAL_TABLET | Freq: Once | ORAL | Status: DC
  Filled 2017-11-13: qty 1

## 2017-11-13 NOTE — ED Triage Notes (Signed)
Patient presents to the ED with left sided flank pain x 6 months that has been increasing.  Patient states pain has occurred since she had a spinal fusion.  Patient also reports having blood in her urine.  Patient states she went to a urologist and they couldn't find anything wrong with her kidneys.  Patient states she has had CT scans that have not showed kidney stones.

## 2017-11-13 NOTE — ED Provider Notes (Signed)
[facility] Emergency Department Provider Note ____________________________________________   First MD Initiated Contact with Patient 11/13/17 1650     (approximate)  I have reviewed the triage vital signs and the nursing notes.   HISTORY  Chief Complaint Flank Pain    HPI Marie Booker is a 52 y.o. female with PMH as noted below who presents with left flank pain for several months, intermittent, and worse over the last day.  It is sharp, and radiates around from her back to her side.  It does not radiate to her leg.  Patient states that she has had similar pain intermittently since she had a spinal fusion about 9 months ago, and has also been previously worked up by a urologist with prior CTs and UAs, and was previously treated for kidney infection with similar pain.  She reports chronic dysuria for months as well, which is not acutely changed today.  She denies any recent trauma, but states that she was with her grandkids the other day, and was playing with and lifting them.   Past Medical History:  Diagnosis Date  . Anemia    iron infusion 4 years ago due to heavy periods  . Arthritis   . Asthma   . Family history of adverse reaction to anesthesia    mom-hard time waking up  . Hypothyroidism   . Pre-diabetes   . RLS (restless legs syndrome)   . Sleep apnea   . Thyroid disease     Patient Active Problem List   Diagnosis Date Noted  . History of uterine fibroid 09/24/2017  . Hypothyroidism 09/24/2017  . Obesity 09/24/2017  . OSA (obstructive sleep apnea) 09/24/2017  . Restless leg syndrome 09/24/2017  . Carpal tunnel syndrome of left wrist 06/28/2017  . Osteoarthritis of carpometacarpal (CMC) joint of thumb 04/27/2017  . Anxiety 04/14/2016  . Bronchitis 06/10/2015  . Major depressive disorder 03/22/2015  . Iron deficiency anemia, unspecified 11/10/2014    Past Surgical History:  Procedure Laterality Date  . APPENDECTOMY    . BACK SURGERY      lower  . CARPAL TUNNEL RELEASE Left 11/07/2017   Procedure: CARPAL TUNNEL RELEASE;  Surgeon: Deeann Saint, MD;  Location: [facility] ORS;  Service: Orthopedics;  Laterality: Left;  . LEG SURGERY      Prior to Admission medications   Medication Sig Start Date End Date Taking? Authorizing Provider  albuterol (PROVENTIL HFA) 108 (90 Base) MCG/ACT inhaler Inhale 1 puff into the lungs daily as needed for allergies. 05/18/17 05/18/18  [provider]  ALPRAZolam Prudy Feeler) 0.5 MG tablet Take 0.5 mg by mouth daily as needed for anxiety. 05/18/17   [provider]  Cyanocobalamin 1000 MCG/ML LIQD Take 1,000 mcg by mouth every 30 (thirty) days.    [provider]  gabapentin (NEURONTIN) 400 MG capsule Take 1 capsule (400 mg total) by mouth 2 (two) times daily. 11/07/17   Deeann Saint, MD  HYDROcodone-acetaminophen (NORCO) 5-325 MG tablet Take 1 tablet by mouth every 6 (six) hours as needed. 11/07/17   Deeann Saint, MD  meloxicam (MOBIC) 15 MG tablet Take 1 tablet (15 mg total) by mouth daily. 11/07/17 11/07/18  Deeann Saint, MD  methocarbamol (ROBAXIN-750) 750 MG tablet Take 750 mg by mouth every 6 (six) hours as needed.     [provider]  nystatin-triamcinolone ointment (MYCOLOG) Apply 1 application topically 2 (two) times daily. Patient taking differently: Apply 1 application topically daily as needed (inflammed skin).  09/24/17  Hildred Laser, MD  PARoxetine Mesylate 7.5 MG CAPS Take 1 tablet by mouth daily. Patient not taking: Reported on 10/29/2017 09/24/17   Hildred Laser, MD  rOPINIRole (REQUIP) 2 MG tablet Take 2 mg by mouth at bedtime.    [provider]  sertraline (ZOLOFT) 100 MG tablet Take 100 mg by mouth at bedtime.     [provider]  thyroid (ARMOUR) 120 MG tablet Take 180 mg by mouth daily before breakfast.     [provider]  traMADol (ULTRAM) 50 MG tablet Take 50 mg by mouth 4 (four) times daily as needed for severe pain.      [provider]  Vitamin D, Ergocalciferol, (DRISDOL) 50000 units CAPS capsule Take 50,000 Units by mouth 2 (two) times a week.    [provider]    Allergies Dilaudid [hydromorphone hcl]  No family history on file.  Social History Social History   Tobacco Use  . Smoking status: Never Smoker  . Smokeless tobacco: Never Used  Substance Use Topics  . Alcohol use: No  . Drug use: No    Review of Systems  Constitutional: No fever. Eyes: No redness. ENT: No neck pain. Cardiovascular: Denies chest pain. Respiratory: Denies shortness of breath. Gastrointestinal: No abdominal pain. Genitourinary: Negative for hematuria.  Musculoskeletal: Positive for back pain. Skin: Negative for rash. Neurological: Negative for focal weakness or numbness.   ____________________________________________   PHYSICAL EXAM:  VITAL SIGNS: ED Triage Vitals [11/13/17 1454]  Enc Vitals Group     BP 102/74     Pulse Rate 75     Resp 16     Temp 98.9 F (37.2 C)     Temp Source Oral     SpO2 99 %     Weight 269 lb (122 kg)     Height 5' 10.5" (1.791 m)     Head Circumference      Peak Flow      Pain Score 8     Pain Loc      Pain Edu?      Excl. in GC?     Constitutional: Alert and oriented. Well appearing and in no acute distress. Eyes: Conjunctivae are normal.  Head: Atraumatic. Nose: No congestion/rhinnorhea. Mouth/Throat: Mucous membranes are moist.   Neck: Normal range of motion.  Cardiovascular: Good peripheral circulation. Respiratory: Normal respiratory effort.  Gastrointestinal: Soft and nontender. No distention.  Genitourinary: No CVA tenderness. Musculoskeletal: No lower extremity edema.  Moderate left upper lumbar paraspinal tenderness extending somewhat to the posterior left flank.  Extremities warm and well perfused.  Negative straight leg raise. Neurologic:  Normal speech and language.  5/5 motor strength and intact sensation of bilateral lower  extremities.  No gross focal neurologic deficits are appreciated.  Skin:  Skin is warm and dry. No rash noted.  No lesions in the area of the pain. Psychiatric: Mood and affect are normal. Speech and behavior are normal.  ____________________________________________   LABS (all labs ordered are listed, but only abnormal results are displayed)  Labs Reviewed  URINALYSIS, COMPLETE (UACMP) WITH MICROSCOPIC - Abnormal; Notable for the following components:      Result Value   Color, Urine YELLOW (*)    APPearance CLEAR (*)    Hgb urine dipstick MODERATE (*)    Leukocytes, UA TRACE (*)    All other components within normal limits  BASIC METABOLIC PANEL  CBC   ____________________________________________  EKG   ____________________________________________  RADIOLOGY  CT abdomen: No  ureteral stone or other acute findings  ____________________________________________   PROCEDURES  Procedure(s) performed: No  Procedures  Critical Care performed: No ____________________________________________   INITIAL IMPRESSION / ASSESSMENT AND PLAN / ED COURSE  Pertinent labs & imaging results that were available during my care of the patient were reviewed by me and considered in my medical decision making (see chart for details).  52 year old female with PMH as noted above and who is status post spinal fusion in her lumbar region about 9 months ago presents with acute worsening of chronic intermittent left back and flank pain over the last day.  Patient states that she normally takes tramadol (although primarily only at night) and it has not been helping.  No neurologic symptoms, and in no acute urinary symptoms.  I reviewed the past medical records in Epic; the patient was seen in January of this year and August of last year for similar symptoms with negative work-ups.  On exam the patient has some localized tenderness, no neurologic findings, no abdominal pain or tenderness.  There  is no rash.  Overall presentation is most consistent with radiculopathy or other nerve related issue, versus possible acute muscle strain on top of her chronic pain.  I do not suspect a urinary cause given the lack of new urinary symptoms, prior negative work-ups, and the patient's unremarkable labs and UA.  Based on shared decision making with the patient we will obtain a CT today (patient last had one in January) to rule out ureteral stone or other acute renal etiology, however if it is negative, patient will be appropriate for discharge home with some additional pain control.  She likely will need outpatient follow-up with her spinal surgeon, possible outpatient MRI, and possible physical therapy.  No indication for MRI today.    ----------------------------------------- 6:56 PM on 11/13/2017 -----------------------------------------  Patient CT is negative.  She states that she would like to go home.  I advised her on the results of the work-up and the plan of care.  She agrees to make an appointment to follow-up with her spinal surgeon.  She states that she has tramadol and meloxicam at home which she can continue to take.  Return precautions given, and she expresses understanding.  ____________________________________________   FINAL CLINICAL IMPRESSION(S) / ED DIAGNOSES  Final diagnoses:  Left flank pain      NEW MEDICATIONS STARTED DURING THIS VISIT:  Discharge Medication List as of 11/13/2017  6:41 PM       Note:  This document was prepared using Dragon voice recognition software and may include unintentional dictation errors.    Dionne Bucy, MD 11/13/17 [id number]

## 2017-11-13 NOTE — ED Notes (Signed)
Pt refuses d/c VS, states she is " jsut ready to go" PT verbalizes d/c understanding and follow up. NAD noted, pt ambulatory upon departure

## 2017-11-13 NOTE — Discharge Instructions (Addendum)
Take your regular pain medications as prescribed.  Make an appointment to follow-up with your spine surgeon within the next few weeks.  Return to the ER for new, worsening, persistent severe pain, weakness or numbness, difficulty walking, fevers, worsening urinary symptoms, abdominal pain, or any other new or worsening symptoms that concern you.

## 2017-11-15 ENCOUNTER — Ambulatory Visit (INDEPENDENT_AMBULATORY_CARE_PROVIDER_SITE_OTHER): Payer: BLUE CROSS/BLUE SHIELD | Admitting: Obstetrics and Gynecology

## 2017-11-15 ENCOUNTER — Encounter: Payer: Self-pay | Admitting: Obstetrics and Gynecology

## 2017-11-15 VITALS — BP 158/88 | HR 73 | Ht 70.0 in | Wt 269.8 lb

## 2017-11-15 DIAGNOSIS — R109 Unspecified abdominal pain: Secondary | ICD-10-CM

## 2017-11-15 DIAGNOSIS — N941 Unspecified dyspareunia: Secondary | ICD-10-CM

## 2017-11-15 DIAGNOSIS — N951 Menopausal and female climacteric states: Secondary | ICD-10-CM

## 2017-11-15 DIAGNOSIS — Z01419 Encounter for gynecological examination (general) (routine) without abnormal findings: Secondary | ICD-10-CM | POA: Diagnosis not present

## 2017-11-15 DIAGNOSIS — R8290 Unspecified abnormal findings in urine: Secondary | ICD-10-CM

## 2017-11-15 DIAGNOSIS — E668 Other obesity: Secondary | ICD-10-CM

## 2017-11-15 NOTE — Progress Notes (Signed)
GYNECOLOGY ANNUAL PHYSICAL EXAM PROGRESS NOTE  Subjective:    Marie Booker is a 52 y.o. postmenopausal (x 1 year) female who presents for an annual exam.  The patient is sexually active.  The patient has never used hormone replacement therapy. Was started on Brisdelle 2 months ago for vasomotor symptoms, however is not taking. The patient denies postmenopausal bleeding. The patient wears seatbelts: yes. The patient participates in regular exercise: no. Has the patient ever been transfused or tattooed?: no. The patient reports that there is not domestic violence in her life.   The patient has the following complaints today:  1. Continues to complain of pain on her left side and flank.  Also complains of urinary odor. Was seen in the Emergency Room 2 days ago for symptoms, notes negative workup for kidney stones and UTI.  Patient has been noting episodes of blood in her urine. Has a f/u with Urologist in several weeks.  2. C/o discomfort with intercourse, mostly with superficial penetration.    Gynecologic History  Menarche age: 50 Patient's last menstrual period was 10/31/2016. She is menopausal Contraception: post menopausal status History of STI's: Denies Last Pap: ~ 2016 (but patient is unsure). Results were: normal.  Denies h/o abnormal pap smears. Last mammogram: 2016. Results were: normal   OB History  Gravida Para Term Preterm AB Living  0 0 0 0 0 3  SAB TAB Ectopic Multiple Live Births  0 0 0 0 0    Past Medical History:  Diagnosis Date  . Anemia    iron infusion 4 years ago due to heavy periods  . Arthritis   . Asthma   . Family history of adverse reaction to anesthesia    mom-hard time waking up  . Hypothyroidism   . Pre-diabetes   . RLS (restless legs syndrome)   . Sleep apnea   . Thyroid disease     Past Surgical History:  Procedure Laterality Date  . APPENDECTOMY    . BACK SURGERY     lower  . CARPAL TUNNEL RELEASE Left 11/07/2017   Procedure: CARPAL  TUNNEL RELEASE;  Surgeon: Deeann Saint, MD;  Location: [facility] ORS;  Service: Orthopedics;  Laterality: Left;  . LEG SURGERY      History reviewed. No pertinent family history.  Social History   Socioeconomic History  . Marital status: Divorced    Spouse name: Not on file  . Number of children: Not on file  . Years of education: Not on file  . Highest education level: Not on file  Occupational History  . Not on file  Social Needs  . Financial resource strain: Not on file  . Food insecurity:    Worry: Not on file    Inability: Not on file  . Transportation needs:    Medical: Not on file    Non-medical: Not on file  Tobacco Use  . Smoking status: Never Smoker  . Smokeless tobacco: Never Used  Substance and Sexual Activity  . Alcohol use: No  . Drug use: No  . Sexual activity: Yes    Birth control/protection: Post-menopausal  Lifestyle  . Physical activity:    Days per week: Not on file    Minutes per session: Not on file  . Stress: Not on file  Relationships  . Social connections:    Talks on phone: Not on file    Gets together: Not on file    Attends religious service: Not on file  Active member of club or organization: Not on file    Attends meetings of clubs or organizations: Not on file    Relationship status: Not on file  . Intimate partner violence:    Fear of current or ex partner: Not on file    Emotionally abused: Not on file    Physically abused: Not on file    Forced sexual activity: Not on file  Other Topics Concern  . Not on file  Social History Narrative  . Not on file    Current Outpatient Medications on File Prior to Visit  Medication Sig Dispense Refill  . albuterol (PROVENTIL HFA) 108 (90 Base) MCG/ACT inhaler Inhale 1 puff into the lungs daily as needed for allergies.    . ALPRAZolam (XANAX) 0.5 MG tablet Take 0.5 mg by mouth daily as needed for anxiety.    . Cyanocobalamin 1000 MCG/ML LIQD Take 1,000 mcg by mouth every 30 (thirty) days.     . meloxicam (MOBIC) 15 MG tablet Take 1 tablet (15 mg total) by mouth daily. 30 tablet 2  . methocarbamol (ROBAXIN-750) 750 MG tablet Take 750 mg by mouth every 6 (six) hours as needed.     . nystatin-triamcinolone ointment (MYCOLOG) Apply 1 application topically 2 (two) times daily. 30 g 0  . rOPINIRole (REQUIP) 2 MG tablet Take 2 mg by mouth at bedtime.    . sertraline (ZOLOFT) 100 MG tablet Take 100 mg by mouth at bedtime.     Marland Kitchen thyroid (ARMOUR) 120 MG tablet Take 180 mg by mouth daily before breakfast.     . traMADol (ULTRAM) 50 MG tablet Take 50 mg by mouth 4 (four) times daily as needed for severe pain.     . Vitamin D, Ergocalciferol, (DRISDOL) 50000 units CAPS capsule Take 50,000 Units by mouth 2 (two) times a week.    . gabapentin (NEURONTIN) 400 MG capsule Take 1 capsule (400 mg total) by mouth 2 (two) times daily. (Patient not taking: Reported on 11/15/2017) 60 capsule 3  . HYDROcodone-acetaminophen (NORCO) 5-325 MG tablet Take 1 tablet by mouth every 6 (six) hours as needed. (Patient not taking: Reported on 11/15/2017) 40 tablet 0  . PARoxetine Mesylate 7.5 MG CAPS Take 1 tablet by mouth daily. (Patient not taking: Reported on 10/29/2017) 30 capsule 3   No current facility-administered medications on file prior to visit.     Allergies  Allergen Reactions  . Dilaudid [Hydromorphone Hcl] Itching     Review of Systems Constitutional: negative for chills, fatigue, fevers and sweats Eyes: negative for irritation, redness and visual disturbance Ears, nose, mouth, throat, and face: negative for hearing loss, nasal congestion, snoring and tinnitus Respiratory: negative for asthma, cough, sputum Cardiovascular: negative for chest pain, dyspnea, exertional chest pressure/discomfort, irregular heart beat, palpitations and syncope Gastrointestinal: negative for abdominal pain, change in bowel habits, nausea and vomiting Genitourinary: negative for abnormal menstrual periods, genital  lesions,and vaginal discharge, dysuria and urinary incontinence. Positive for left side/flank pain, urinary odor, and dyspareunia.  Integument/breast: negative for breast lump, breast tenderness and nipple discharge Hematologic/lymphatic: negative for bleeding and easy bruising Musculoskeletal:negative for back pain and muscle weakness Neurological: negative for dizziness, headaches, vertigo and weakness Endocrine: negative for diabetic symptoms including polydipsia, polyuria and skin dryness Allergic/Immunologic: negative for hay fever and urticaria      Objective:  Blood pressure (!) 158/88, pulse 73, height  (1.778 m), weight 269 lb 12.8 oz (122.4 kg), last menstrual period 10/31/2016. Body mass index is 38.71  kg/m.   General Appearance:    Alert, cooperative, no distress, appears stated age, moderate obesity  Head:    Normocephalic, without obvious abnormality, atraumatic  Eyes:    PERRL, conjunctiva/corneas clear, EOM's intact, both eyes  Ears:    Normal external ear canals, both ears  Nose:   Nares normal, septum midline, mucosa normal, no drainage or sinus tenderness  Throat:   Lips, mucosa, and tongue normal; teeth and gums normal  Neck:   Supple, symmetrical, trachea midline, no adenopathy; thyroid: no enlargement/tenderness/nodules; no carotid bruit or JVD  Back:     Symmetric, no curvature, ROM normal, no CVA tenderness  Lungs:     Clear to auscultation bilaterally, respirations unlabored  Chest Wall:    No tenderness or deformity   Heart:    Regular rate and rhythm, S1 and S2 normal, no murmur, rub or gallop  Breast Exam:    No tenderness, masses, or nipple abnormality  Abdomen:     Soft, bowel sounds active all four quadrants, no masses, no organomegaly.  Mild tenderness of left mid to lower abdomen on side.   Genitalia:    Pelvic:external genitalia normal, vagina without lesions, discharge, or tenderness.  Mild to moderate tenderness along left vaginal wall. No  nodularity palpated. Mild pallor of urethra and vaginal introitus present, but otherwise normal appearing vagina.  Rectovaginal septum  normal. Cervix normal in appearance, no cervical motion tenderness, no adnexal masses or tenderness.  Uterus normal size, shape, mobile, regular contours, nontender.  Rectal:    Normal external sphincter.  No hemorrhoids appreciated. Internal exam not done.   Extremities:   Extremities normal, atraumatic, no cyanosis or edema  Pulses:   2+ and symmetric all extremities  Skin:   Skin color, texture, turgor normal, no rashes or lesions  Lymph nodes:   Cervical, supraclavicular, and axillary nodes normal  Neurologic:   CNII-XII intact, normal strength, sensation and reflexes throughout   .  Labs:  Lab Results  Component Value Date   WBC 5.8 11/13/2017   HGB 13.4 11/13/2017   HCT 39.0 11/13/2017   MCV 84.9 11/13/2017   PLT 244 11/13/2017    Lab Results  Component Value Date   CREATININE 0.94 11/13/2017   BUN 17 11/13/2017   NA 138 11/13/2017   K 4.3 11/13/2017   CL 106 11/13/2017   CO2 24 11/13/2017    Lab Results  Component Value Date   ALT 16 06/23/2017   AST 23 06/23/2017   ALKPHOS 91 06/23/2017   BILITOT 0.6 06/23/2017    No results found for: TSH   No results found for: CHOL, HDL, LDLCALC, LDLDIRECT, TRIG, CHOLHDL   No results found for: HGBA1C    Assessment:    Routine gynecologic exam.   Menopausal syndrome  Vaginal atrophy  Dyspareunia  Moderate obesity  Flank pain  Urinary odor   Plan:     Blood tests: Lipoproteins and random glucose.  All other labs up to date. Breast self exam technique reviewed and patient encouraged to perform self-exam monthly. Contraception: post menopausal status. Discussed healthy lifestyle modifications. Mammogram previously ordered by PCP, patient needs to schedule Pap smear performed today.  Patient with continued left side/flank pain, complains of vagina odor. ER labs reviewed with no  evidence of acute UTI, but will order culture to assess for bacteriuria. Patient will also need to keep f/u appointment with Urology. Also advised on taking a muscle relaxer regularly (prescribed, but patient does not take this very  often) as pain could also be musculoskeletal.  Menopausal syndrome, with mild vaginal atrophy and dyspreunia.  Patient apparently has not been taking Brisdelle as prescribed. Is already taking 1 other non-hormonal option (Gabapentin, however does not take consistently due to side effects).  Patient still does not desire to take oral HRT, but is ok to use local estrogen therapy for her urinary and vaginal complaints. Given samples of Premarin cream.  To apply 2-3 times weekly.   To f/u in 1 year, or sooner as needed.    Hildred Laser, MD [facility]

## 2017-11-15 NOTE — Patient Instructions (Signed)
Health Maintenance for Postmenopausal Women Menopause is a normal process in which your reproductive ability comes to an end. This process happens gradually over a span of months to years, usually between the ages of 22 and 9. Menopause is complete when you have missed 12 consecutive menstrual periods. It is important to talk with your health care provider about some of the most common conditions that affect postmenopausal women, such as heart disease, cancer, and bone loss (osteoporosis). Adopting a healthy lifestyle and getting preventive care can help to promote your health and wellness. Those actions can also lower your chances of developing some of these common conditions. What should I know about menopause? During menopause, you may experience a number of symptoms, such as:  Moderate-to-severe hot flashes.  Night sweats.  Decrease in sex drive.  Mood swings.  Headaches.  Tiredness.  Irritability.  Memory problems.  Insomnia.  Choosing to treat or not to treat menopausal changes is an individual decision that you make with your health care provider. What should I know about hormone replacement therapy and supplements? Hormone therapy products are effective for treating symptoms that are associated with menopause, such as hot flashes and night sweats. Hormone replacement carries certain risks, especially as you become older. If you are thinking about using estrogen or estrogen with progestin treatments, discuss the benefits and risks with your health care provider. What should I know about heart disease and stroke? Heart disease, heart attack, and stroke become more likely as you age. This may be due, in part, to the hormonal changes that your body experiences during menopause. These can affect how your body processes dietary fats, triglycerides, and cholesterol. Heart attack and stroke are both medical emergencies. There are many things that you can do to help prevent heart disease  and stroke:  Have your blood pressure checked at least every 1-2 years. High blood pressure causes heart disease and increases the risk of stroke.  If you are 53-22 years old, ask your health care provider if you should take aspirin to prevent a heart attack or a stroke.  Do not use any tobacco products, including cigarettes, chewing tobacco, or electronic cigarettes. If you need help quitting, ask your health care provider.  It is important to eat a healthy diet and maintain a healthy weight. ? Be sure to include plenty of vegetables, fruits, low-fat dairy products, and lean protein. ? Avoid eating foods that are high in solid fats, added sugars, or salt (sodium).  Get regular exercise. This is one of the most important things that you can do for your health. ? Try to exercise for at least 150 minutes each week. The type of exercise that you do should increase your heart rate and make you sweat. This is known as moderate-intensity exercise. ? Try to do strengthening exercises at least twice each week. Do these in addition to the moderate-intensity exercise.  Know your numbers.Ask your health care provider to check your cholesterol and your blood glucose. Continue to have your blood tested as directed by your health care provider.  What should I know about cancer screening? There are several types of cancer. Take the following steps to reduce your risk and to catch any cancer development as early as possible. Breast Cancer  Practice breast self-awareness. ? This means understanding how your breasts normally appear and feel. ? It also means doing regular breast self-exams. Let your health care provider know about any changes, no matter how small.  If you are 40  or older, have a clinician do a breast exam (clinical breast exam or CBE) every year. Depending on your age, family history, and medical history, it may be recommended that you also have a yearly breast X-ray (mammogram).  If you  have a family history of breast cancer, talk with your health care provider about genetic screening.  If you are at high risk for breast cancer, talk with your health care provider about having an MRI and a mammogram every year.  Breast cancer (BRCA) gene test is recommended for women who have family members with BRCA-related cancers. Results of the assessment will determine the need for genetic counseling and BRCA1 and for BRCA2 testing. BRCA-related cancers include these types: ? Breast. This occurs in males or females. ? Ovarian. ? Tubal. This may also be called fallopian tube cancer. ? Cancer of the abdominal or pelvic lining (peritoneal cancer). ? Prostate. ? Pancreatic.  Cervical, Uterine, and Ovarian Cancer Your health care provider may recommend that you be screened regularly for cancer of the pelvic organs. These include your ovaries, uterus, and vagina. This screening involves a pelvic exam, which includes checking for microscopic changes to the surface of your cervix (Pap test).  For women ages 21-65, health care providers may recommend a pelvic exam and a Pap test every three years. For women ages 79-65, they may recommend the Pap test and pelvic exam, combined with testing for human papilloma virus (HPV), every five years. Some types of HPV increase your risk of cervical cancer. Testing for HPV may also be done on women of any age who have unclear Pap test results.  Other health care providers may not recommend any screening for nonpregnant women who are considered low risk for pelvic cancer and have no symptoms. Ask your health care provider if a screening pelvic exam is right for you.  If you have had past treatment for cervical cancer or a condition that could lead to cancer, you need Pap tests and screening for cancer for at least 20 years after your treatment. If Pap tests have been discontinued for you, your risk factors (such as having a new sexual partner) need to be  reassessed to determine if you should start having screenings again. Some women have medical problems that increase the chance of getting cervical cancer. In these cases, your health care provider may recommend that you have screening and Pap tests more often.  If you have a family history of uterine cancer or ovarian cancer, talk with your health care provider about genetic screening.  If you have vaginal bleeding after reaching menopause, tell your health care provider.  There are currently no reliable tests available to screen for ovarian cancer.  Lung Cancer Lung cancer screening is recommended for adults 69-62 years old who are at high risk for lung cancer because of a history of smoking. A yearly low-dose CT scan of the lungs is recommended if you:  Currently smoke.  Have a history of at least 30 pack-years of smoking and you currently smoke or have quit within the past 15 years. A pack-year is smoking an average of one pack of cigarettes per day for one year.  Yearly screening should:  Continue until it has been 15 years since you quit.  Stop if you develop a health problem that would prevent you from having lung cancer treatment.  Colorectal Cancer  This type of cancer can be detected and can often be prevented.  Routine colorectal cancer screening usually begins at  age 42 and continues through age 45.  If you have risk factors for colon cancer, your health care provider may recommend that you be screened at an earlier age.  If you have a family history of colorectal cancer, talk with your health care provider about genetic screening.  Your health care provider may also recommend using home test kits to check for hidden blood in your stool.  A small camera at the end of a tube can be used to examine your colon directly (sigmoidoscopy or colonoscopy). This is done to check for the earliest forms of colorectal cancer.  Direct examination of the colon should be repeated every  5-10 years until age 71. However, if early forms of precancerous polyps or small growths are found or if you have a family history or genetic risk for colorectal cancer, you may need to be screened more often.  Skin Cancer  Check your skin from head to toe regularly.  Monitor any moles. Be sure to tell your health care provider: ? About any new moles or changes in moles, especially if there is a change in a mole's shape or color. ? If you have a mole that is larger than the size of a pencil eraser.  If any of your family members has a history of skin cancer, especially at a young age, talk with your health care provider about genetic screening.  Always use sunscreen. Apply sunscreen liberally and repeatedly throughout the day.  Whenever you are outside, protect yourself by wearing long sleeves, pants, a wide-brimmed hat, and sunglasses.  What should I know about osteoporosis? Osteoporosis is a condition in which bone destruction happens more quickly than new bone creation. After menopause, you may be at an increased risk for osteoporosis. To help prevent osteoporosis or the bone fractures that can happen because of osteoporosis, the following is recommended:  If you are 46-71 years old, get at least 1,000 mg of calcium and at least 600 mg of vitamin D per day.  If you are older than age 55 but younger than age 65, get at least 1,200 mg of calcium and at least 600 mg of vitamin D per day.  If you are older than age 54, get at least 1,200 mg of calcium and at least 800 mg of vitamin D per day.  Smoking and excessive alcohol intake increase the risk of osteoporosis. Eat foods that are rich in calcium and vitamin D, and do weight-bearing exercises several times each week as directed by your health care provider. What should I know about how menopause affects my mental health? Depression may occur at any age, but it is more common as you become older. Common symptoms of depression  include:  Low or sad mood.  Changes in sleep patterns.  Changes in appetite or eating patterns.  Feeling an overall lack of motivation or enjoyment of activities that you previously enjoyed.  Frequent crying spells.  Talk with your health care provider if you think that you are experiencing depression. What should I know about immunizations? It is important that you get and maintain your immunizations. These include:  Tetanus, diphtheria, and pertussis (Tdap) booster vaccine.  Influenza every year before the flu season begins.  Pneumonia vaccine.  Shingles vaccine.  Your health care provider may also recommend other immunizations. This information is not intended to replace advice given to you by your health care provider. Make sure you discuss any questions you have with your health care provider. Document Released: 07/28/2005  Document Revised: 12/24/2015 Document Reviewed: 03/09/2015 Elsevier Interactive Patient Education  2018 [employer] Inc[employer]  

## 2017-11-15 NOTE — Progress Notes (Signed)
Pt stated that it is painful to urinate and have intercourse. Pt also stated that she has left side sharp pain x several months, but recently increased.

## 2017-11-17 LAB — URINE CULTURE

## 2017-11-18 ENCOUNTER — Encounter: Payer: Self-pay | Admitting: Obstetrics and Gynecology

## 2017-11-18 MED ORDER — ESTROGENS, CONJUGATED 0.625 MG/GM VA CREA: 1.0000 | TOPICAL_CREAM | VAGINAL | 12 refills | Status: DC

## 2017-11-19 ENCOUNTER — Telehealth: Payer: Self-pay | Admitting: Obstetrics and Gynecology

## 2017-11-19 LAB — IGP, COBASHPV16/18
HPV 16: NEGATIVE
HPV 18: NEGATIVE
HPV other hr types: NEGATIVE
PAP SMEAR COMMENT: 0

## 2017-11-19 NOTE — Telephone Encounter (Signed)
Pt called back and went over test results with pt.

## 2017-11-19 NOTE — Telephone Encounter (Signed)
The patient called and stated that she missed a [facility], and would like a call back if possible. Please advise.

## 2018-01-07 ENCOUNTER — Other Ambulatory Visit: Payer: Self-pay | Admitting: Physical Medicine and Rehabilitation

## 2018-01-07 DIAGNOSIS — M21379 Foot drop, unspecified foot: Secondary | ICD-10-CM

## 2018-01-18 ENCOUNTER — Ambulatory Visit
Admission: RE | Admit: 2018-01-18 | Discharge: 2018-01-18 | Disposition: A | Discharge status: Home / Self Care | Payer: BLUE CROSS/BLUE SHIELD | Source: Ambulatory Visit | Attending: Physical Medicine and Rehabilitation | Admitting: Physical Medicine and Rehabilitation

## 2018-01-18 DIAGNOSIS — M21371 Foot drop, right foot: Secondary | ICD-10-CM | POA: Insufficient documentation

## 2018-01-18 DIAGNOSIS — M5126 Other intervertebral disc displacement, lumbar region: Secondary | ICD-10-CM | POA: Diagnosis not present

## 2018-01-18 DIAGNOSIS — M21379 Foot drop, unspecified foot: Secondary | ICD-10-CM

## 2018-04-12 ENCOUNTER — Ambulatory Visit: Payer: BLUE CROSS/BLUE SHIELD | Admitting: Obstetrics and Gynecology

## 2018-04-12 ENCOUNTER — Encounter: Payer: Self-pay | Admitting: Obstetrics and Gynecology

## 2018-04-12 VITALS — BP 142/72 | HR 68 | Wt 269.9 lb

## 2018-04-12 DIAGNOSIS — R109 Unspecified abdominal pain: Secondary | ICD-10-CM | POA: Diagnosis not present

## 2018-04-12 DIAGNOSIS — N941 Unspecified dyspareunia: Secondary | ICD-10-CM

## 2018-04-12 LAB — POCT URINALYSIS DIPSTICK
GLUCOSE UA: NEGATIVE
Ketones, UA: NEGATIVE
Nitrite, UA: NEGATIVE
PROTEIN UA: NEGATIVE
Spec Grav, UA: 1.02 (ref 1.010–1.025)
Urobilinogen, UA: 0.2 E.U./dL
pH, UA: 6 (ref 5.0–8.0)

## 2018-04-12 MED ORDER — MICONAZOLE NITRATE 2 % EX POWD: CUTANEOUS | 0 refills | Status: DC | PRN

## 2018-04-12 MED ORDER — CLINDAMYCIN PHOSPHATE (1 DOSE) 2 % VA CREA: 1.0000 | TOPICAL_CREAM | Freq: Two times a day (BID) | VAGINAL | 1 refills | Status: DC

## 2018-04-12 MED ORDER — FLUCONAZOLE 150 MG PO TABS: 150.0000 mg | ORAL_TABLET | ORAL | 3 refills | Status: DC

## 2018-04-12 NOTE — Progress Notes (Signed)
  Subjective:     Patient ID: Marie Booker, female   DOB: 24-Nov-1965, 52 y.o.   MRN: [id number]  HPI Has a few concerns: 1. 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 Has been getting monthly massages and they help for a while. Can't take gabapentin as it makes her sick. And muscle relaxants make her very sleepy. Has lost about 15 #s in last year, but limited in exercise as she needs bilateral knee surgery and back pain is exacerbated with most exercises. She is walking.  2. Saw urologist and the did full work up including a scope of bladder and found nothing irregular or cause of hematuria. Referred her back to here. 3.Pain with sex for last year with deep penetration. Is so painful she hasn't had sex in months. States her spouse says things feel 'different' too deep in the vagina. Also notices increased vaginal discharge. Dr Valentino Saxon prescribed a estrogen cream and she has used it a few times after sex with no relief.  Review of Systems  Constitutional: Positive for fatigue.  Genitourinary: Positive for flank pain, vaginal discharge and vaginal pain.  Musculoskeletal: Positive for back pain.  All other systems reviewed and are negative.      Objective:   Physical Exam A&Ox4 Well groomed female in no distress Blood pressure (!) 142/72, pulse 68, weight 269 lb 14.4 oz (122.4 kg), last menstrual period 10/31/2016.  Body mass index is 38.73 kg/m.  Abdomen soft and not tender Negative CVA, with generalized muscle tenderness on palpation on left mid back around to ribs. Pelvic exam: VULVA: normal appearing vulva with no masses, tenderness or lesions, vulvar hypopigmentation throughout, VAGINA: atrophic, vaginal erythema in posterior forchette , CERVIX: cervical discharge present - copious, green, thin and vaginal erythema noted, UTERUS: uterus is normal size, shape, consistency and nontender, ADNEXA: normal adnexa in size, nontender  and no masses, WET MOUNT done - results: KOH done, excessive bacteria, lactobacilli, white blood cells, RBC noted through out.     Urinalysis    Component Value Date/Time   COLORURINE YELLOW (A) 11/13/2017 1500   APPEARANCEUR CLEAR (A) 11/13/2017 1500   APPEARANCEUR Cloudy 09/19/2014 2224   LABSPEC 1.021 11/13/2017 1500   LABSPEC 1.030 09/19/2014 2224   PHURINE 5.0 11/13/2017 1500   GLUCOSEU NEGATIVE 11/13/2017 1500   GLUCOSEU Negative 09/19/2014 2224   HGBUR MODERATE (A) 11/13/2017 1500   BILIRUBINUR small 04/12/2018 1006   BILIRUBINUR Negative 09/19/2014 2224   KETONESUR NEGATIVE 11/13/2017 1500   PROTEINUR Negative 04/12/2018 1006   PROTEINUR NEGATIVE 11/13/2017 1500   UROBILINOGEN 0.2 04/12/2018 1006   NITRITE neg 04/12/2018 1006   NITRITE NEGATIVE 11/13/2017 1500   LEUKOCYTESUR Large (3+) (A) 04/12/2018 1006   LEUKOCYTESUR Negative 09/19/2014 2224    Assessment:     dysparenia Left flank pain Cervicitis Yeast infection Vaginal atrophy    Plan:    NuSwab sent to laburine sent for culture. Diflucan 150mg  every three days x 3 doses lotrisone powder refilled for use under the breast clidamycin vaginal cream nightly x 3 doses Stop estrogen cream for now RTC in 2 weeks for pelvic u/s and recheck of cervicitis.   Still feel like left flank pain is musculoskeletal in origin and encouraged continued massages, medications and f/u with neuorosurgeon as needed. Also discussed weight loss and current weights influence on back issues.   Iven Earnhart,CNM

## 2018-04-15 LAB — NUSWAB VAGINITIS PLUS (VG+)
CANDIDA ALBICANS, NAA: NEGATIVE
CANDIDA GLABRATA, NAA: NEGATIVE
CHLAMYDIA TRACHOMATIS, NAA: NEGATIVE
NEISSERIA GONORRHOEAE, NAA: NEGATIVE
Trich vag by NAA: NEGATIVE

## 2018-04-16 ENCOUNTER — Other Ambulatory Visit: Payer: Self-pay | Admitting: Obstetrics and Gynecology

## 2018-04-16 DIAGNOSIS — N941 Unspecified dyspareunia: Secondary | ICD-10-CM

## 2018-04-18 ENCOUNTER — Ambulatory Visit (INDEPENDENT_AMBULATORY_CARE_PROVIDER_SITE_OTHER): Payer: BLUE CROSS/BLUE SHIELD | Admitting: Obstetrics and Gynecology

## 2018-04-18 ENCOUNTER — Ambulatory Visit (INDEPENDENT_AMBULATORY_CARE_PROVIDER_SITE_OTHER): Payer: BLUE CROSS/BLUE SHIELD

## 2018-04-18 ENCOUNTER — Encounter: Payer: Self-pay | Admitting: Obstetrics and Gynecology

## 2018-04-18 VITALS — BP 124/64 | HR 72 | Ht 70.0 in | Wt 271.5 lb

## 2018-04-18 DIAGNOSIS — R109 Unspecified abdominal pain: Secondary | ICD-10-CM | POA: Diagnosis not present

## 2018-04-18 DIAGNOSIS — N941 Unspecified dyspareunia: Secondary | ICD-10-CM

## 2018-04-18 NOTE — Progress Notes (Signed)
  Subjective:     Patient ID: Marie Booker, female   DOB: [birth year], 52 y.o.   MRN: [id number]  HPI  Still having LUQ pains. Has taken most of the prescribed medications and states urination no longer painful.   Review of Systems  All other systems reviewed and are negative.      Objective:   Physical Exam A&Ox4 Well groomed female in no distress Blood pressure 124/64, pulse 72, height 5\' 10"  (1.778 m), weight 271 lb 8 oz (123.2 kg), last menstrual period 10/31/2016. Indications:Dyspareunia Findings:  The uterus is anteverted and measures 8.8x4.1x4.2cm. Echo texture is homogenous without evidence of focal masses. The Endometrium measures 6.4 mm.  Right Ovary measures not seen d/t filled bowel Left Ovary measures 1x1.1x1.2cm. It is normal in appearance. Survey of the adnexa demonstrates no adnexal masses. There is no free fluid in the cul de sac.      Assessment:     Impression: 1. Normal GYN US exam 2. dysparenia    Plan:     Reassured of normal findings. Will let me know if cervicitis symptoms return and we will re-examine.  Jesly Hartmann [place], CNM

## 2018-05-20 ENCOUNTER — Other Ambulatory Visit: Payer: Self-pay | Admitting: Family

## 2018-05-20 DIAGNOSIS — Z1231 Encounter for screening mammogram for malignant neoplasm of breast: Secondary | ICD-10-CM

## 2018-08-13 ENCOUNTER — Other Ambulatory Visit (HOSPITAL_COMMUNITY)
Admission: RE | Admit: 2018-08-13 | Discharge: 2018-08-13 | Disposition: A | Discharge status: Home / Self Care | Payer: BLUE CROSS/BLUE SHIELD | Source: Ambulatory Visit | Attending: Obstetrics and Gynecology | Admitting: Obstetrics and Gynecology

## 2018-08-13 ENCOUNTER — Encounter: Payer: Self-pay | Admitting: Obstetrics and Gynecology

## 2018-08-13 ENCOUNTER — Ambulatory Visit: Payer: BLUE CROSS/BLUE SHIELD | Admitting: Obstetrics and Gynecology

## 2018-08-13 VITALS — BP 130/68 | HR 64 | Ht 70.0 in | Wt 274.6 lb

## 2018-08-13 DIAGNOSIS — R109 Unspecified abdominal pain: Secondary | ICD-10-CM | POA: Diagnosis not present

## 2018-08-13 DIAGNOSIS — N930 Postcoital and contact bleeding: Secondary | ICD-10-CM

## 2018-08-13 DIAGNOSIS — N950 Postmenopausal bleeding: Secondary | ICD-10-CM | POA: Diagnosis present

## 2018-08-13 DIAGNOSIS — Z713 Dietary counseling and surveillance: Secondary | ICD-10-CM | POA: Diagnosis not present

## 2018-08-13 DIAGNOSIS — Z6839 Body mass index (BMI) 39.0-39.9, adult: Secondary | ICD-10-CM | POA: Diagnosis not present

## 2018-08-13 DIAGNOSIS — R310 Gross hematuria: Secondary | ICD-10-CM

## 2018-08-13 LAB — POCT URINALYSIS DIPSTICK
BILIRUBIN UA: NEGATIVE
GLUCOSE UA: NEGATIVE
Ketones, UA: NEGATIVE
Nitrite, UA: NEGATIVE
Protein, UA: NEGATIVE
Spec Grav, UA: 1.015 (ref 1.010–1.025)
Urobilinogen, UA: 0.2 E.U./dL
pH, UA: 6 (ref 5.0–8.0)

## 2018-08-13 MED ORDER — CYANOCOBALAMIN 1000 MCG/ML IJ SOLN: 1000.0000 ug | INTRAMUSCULAR | 1 refills | Status: AC

## 2018-08-13 MED ORDER — PHENTERMINE HCL 37.5 MG PO TABS: 37.5000 mg | ORAL_TABLET | Freq: Every day | ORAL | 2 refills | Status: DC

## 2018-08-13 NOTE — Progress Notes (Signed)
  Subjective:     Patient ID: Marie Booker, female   DOB: 1965/08/04, 53 y.o.   MRN: [id number]  HPI Reports left flank pain worse again, and intermittently seeing blood in her urine. Uses urine dip stick at work and it always shows blood in urine. Also occasionally sees blood in the vaginal mucus when wiping. Had previous full work up at urologist with negative findings and no explanation of hematuria.  Does reports h/o 5 months of heavy bleeding before menopause.they tried her on a few packs of OCPs at [facility] with no resolution.  No menses in 18 months.   Still having severe pain with sex, feels like she is moist enough, but it still hurts, especially with deep penetration. Also sees spotting after sex. Last intercourse over a month ago.   Upper back pain due to large breast size, and getting yeast under breast occasionally. Currently wearing a 23F bra size. Has reported it to previous provider. Desires breast reduction surgery. Feels like that is also contributing to low back pain.   Working out 3 times a week with a Psychologist, educational.  Desires restart weight loss medication. Considering seeing a nutritionist.   Review of Systems  Constitutional: Positive for fatigue.  Genitourinary: Positive for dyspareunia, flank pain and vaginal bleeding.  All other systems reviewed and are negative.      Objective:   Physical Exam A&ox4 Well groomed female in no distress Blood pressure 130/68, pulse 64, height 5\' 10"  (1.778 m), weight 274 lb 9.6 oz (124.6 kg), last menstrual period 10/31/2016. Body mass index is 39.4 kg/m.  Breasts: breasts appear normal, no suspicious masses, no skin or nipple changes or axillary nodes. Pelvic exam: normal external genitalia, vulva, vagina, cervix, uterus and adnexa, endometrial biopsy obtained without difficulty.   Urinalysis    Component Value Date/Time   COLORURINE YELLOW (A) 11/13/2017 1500   APPEARANCEUR CLEAR (A) 11/13/2017 1500   APPEARANCEUR Cloudy 09/19/2014  2224   LABSPEC 1.021 11/13/2017 1500   LABSPEC 1.030 09/19/2014 2224   PHURINE 5.0 11/13/2017 1500   GLUCOSEU NEGATIVE 11/13/2017 1500   GLUCOSEU Negative 09/19/2014 2224   HGBUR MODERATE (A) 11/13/2017 1500   BILIRUBINUR neg 08/13/2018 0908   BILIRUBINUR Negative 09/19/2014 2224   KETONESUR NEGATIVE 11/13/2017 1500   PROTEINUR Negative 08/13/2018 0908   PROTEINUR NEGATIVE 11/13/2017 1500   UROBILINOGEN 0.2 08/13/2018 0908   NITRITE neg 08/13/2018 0908   NITRITE NEGATIVE 11/13/2017 1500   LEUKOCYTESUR Small (1+) (A) 08/13/2018 0908   LEUKOCYTESUR Negative 09/19/2014 2224       Assessment:     Left flank pain BMI 39 dysparenia Hematuria Postcoital spotting PMB    Plan:     Still feel like flank pain is muscloskelatal and want her to discuss with neurosurgeon at upcoming visit.  counseled at length regarding common causes of vaginal spotting, especially in leu of being postmenopausal. Recommended r/u uterine hyperplasia and patient consents to endometrial biopsy. Will follow up accordingly. No other labs obtained at this time.  Restarted on weight loss medication. B12 injection given, and will return in 4 weeks for recheck. Encouraged modifying diet, continued regular exercise. And she will consider referral to nutritionist. Discussed intermittent fasting but she doesn't desires that as she doesn't feel like she has the will power.    >50% of 30 minute visit spent in counseling.   ,CNM

## 2018-08-13 NOTE — Addendum Note (Signed)
Addended by: Rosine Beat L on: 08/13/2018 02:35 PM   Modules accepted: Orders

## 2018-08-29 ENCOUNTER — Telehealth: Payer: Self-pay | Admitting: Obstetrics and Gynecology

## 2018-08-29 NOTE — Telephone Encounter (Signed)
I called the patient back to schedule a surgery consult apt with Dr. Valentino Saxon. I did not receive an answer so I left the patient a voicemail to return call. Please advise.

## 2018-09-06 ENCOUNTER — Telehealth: Payer: Self-pay | Admitting: Obstetrics and Gynecology

## 2018-09-06 ENCOUNTER — Encounter: Payer: BLUE CROSS/BLUE SHIELD | Admitting: Obstetrics and Gynecology

## 2018-09-06 NOTE — Telephone Encounter (Signed)
Patient called stating she has yeast under her breast and vaginal area and would like [facility].Thanks

## 2018-09-09 MED ORDER — MICONAZOLE NITRATE 2 % EX POWD: CUTANEOUS | 1 refills | Status: DC | PRN

## 2018-09-09 MED ORDER — FLUCONAZOLE 150 MG PO TABS: 150.0000 mg | ORAL_TABLET | ORAL | 1 refills | Status: DC

## 2018-09-09 NOTE — Telephone Encounter (Signed)
Rash under breast- x 2 weeks.   MNS has treated in the past. Will give diflucan and nystatin power. If no better in 2 weeks will need to be seen.

## 2018-09-12 ENCOUNTER — Encounter: Payer: BLUE CROSS/BLUE SHIELD | Admitting: Obstetrics and Gynecology

## 2018-09-12 ENCOUNTER — Encounter: Payer: Self-pay | Admitting: Obstetrics and Gynecology

## 2018-09-12 ENCOUNTER — Other Ambulatory Visit: Payer: Self-pay

## 2018-09-12 ENCOUNTER — Telehealth: Payer: Self-pay

## 2018-09-12 ENCOUNTER — Ambulatory Visit (INDEPENDENT_AMBULATORY_CARE_PROVIDER_SITE_OTHER): Payer: BLUE CROSS/BLUE SHIELD | Admitting: Obstetrics and Gynecology

## 2018-09-12 VITALS — Ht 70.0 in | Wt 274.0 lb

## 2018-09-12 DIAGNOSIS — Z8742 Personal history of other diseases of the female genital tract: Secondary | ICD-10-CM | POA: Diagnosis not present

## 2018-09-12 DIAGNOSIS — N950 Postmenopausal bleeding: Secondary | ICD-10-CM | POA: Diagnosis not present

## 2018-09-12 DIAGNOSIS — N840 Polyp of corpus uteri: Secondary | ICD-10-CM | POA: Diagnosis not present

## 2018-09-12 DIAGNOSIS — R102 Pelvic and perineal pain: Secondary | ICD-10-CM

## 2018-09-12 DIAGNOSIS — N952 Postmenopausal atrophic vaginitis: Secondary | ICD-10-CM

## 2018-09-12 MED ORDER — NORETHINDRONE ACETATE 5 MG PO TABS: 5.0000 mg | ORAL_TABLET | Freq: Every day | ORAL | 2 refills | Status: DC

## 2018-09-12 MED ORDER — ESTROGENS, CONJUGATED 0.625 MG/GM VA CREA: 1.0000 | TOPICAL_CREAM | VAGINAL | 4 refills | Status: DC

## 2018-09-12 MED ORDER — KETOROLAC TROMETHAMINE 10 MG PO TABS: 10.0000 mg | ORAL_TABLET | Freq: Four times a day (QID) | ORAL | 1 refills | Status: DC | PRN

## 2018-09-12 NOTE — Progress Notes (Signed)
Virtual Visit via Telephone Note  I connected with Marie Booker on 09/12/18 at  2:00 PM EDT by telephone and verified that I am speaking with the correct person using two identifiers.   I discussed the limitations, risks, security and privacy concerns of performing an evaluation and management service by telephone and the availability of in person appointments. I also discussed with the patient that there may be a patient responsible charge related to this service. The patient expressed understanding and agreed to proceed.  The patient received the call from home and I was located in my office.    History of Present Illness: Marie Booker is a 53 y.o. G3P3 menopausal female who presents for scheduled telephone encounter for several issues: 1.  Patient has a history of endometriosis. Notes that she has had pelvic pain off and on for the past year, that usually was relieved by Tylenol or Ibuprofen, but over the last few months she has been noting increasing pain that was managed by Tramadol.  However reports last night, the pain was so severe that she even Tramadol was not helping.  Notes that she was doubled over in pain, and was called nurse triage who advised her to go to the Emergency Room but opted not to go in light of the emerging Coronavirus pandemic. She notes pain is better today after she was instructed to take Tylenol and Ibuprofen rotating in addition to her Tramadol.  2. Patient has had PMB.  She was worked up by [employer], CNM with an ultrasound that noted the presence of an endometrial polyp.  She notes also a prior history of endometrial ablation in the past. Was scheduled today for an in-office visit to discuss surgery, however now that surgeries are on hold until at least May, opted for a telephone encounter.     Observations/Objective: Patient does not sound to be in any apparent distress during telephone encounter. Vitals not obtained during this visit.   Assessment and  Plan:  1. Pelvic pain 2. H/o endometriosis  3. PMB 4. Endometrial polyp 5. Vaginal atrophy  Follow Up Instructions:  1. Patient encouraged to continue use of Tylenol or Ibuprofen for mild to moderate pain, and Tramdol for severe pain.  Patient request that instead of Ibuprofen, can she be prescribed Toradol as she reports this helped tremendously during a prior ER visit.  Will prescribe. Cautioned against concomittant use with Ibuprofen due to risk of GI bleeding, as well as short term use due to also being on SSRIs (with GI bleed). Advised that if pain continues to worsen, she may need to be seen in the office to discuss not only management of the polyp, but potentially hysterectomy for her pain.  2. PMB - likely secondary to endometrial polyp, however on further discussion with patient, she is also noting mild pain with urination.  She notes that it is not dysuria, but more of an irritation at the urethra. Has been worked up by Urology for episodes of hematuria with negative workup. Discussed that bleeding could also be potentially from the urethra due to atrophy.  Will prescribe Premarin to place at the urethra to see if this will help to improve symptoms.  3. Endometrial polyp - discussed need for a D&C for polyp/further evaluation of endometrium in light of prior history of endometrial ablation. However if pain continues, definitve management with hysterectomy is also a consideration. Will send in Aygestin to help with any bleeding until next visit.   I  discussed the assessment and treatment plan with the patient. The patient was provided an opportunity to ask questions and all were answered. The patient agreed with the plan and demonstrated an understanding of the instructions.   The patient was advised to call back or seek an in-person evaluation if the symptoms worsen or if the condition fails to improve as anticipated.  I provided 23 minutes of non-face-to-face time during this  encounter.   Marie Laser, MD  [facility]

## 2018-09-12 NOTE — Telephone Encounter (Signed)
Pt called to see if she could come in at 1:30pm to see Dr. Valentino Saxon.  Pt stated that she could not come in this evening due to having her grandson with her. Pt asked if she could do a telephone visit. Pt made an appt to see [facility] this evening via telephone visit. Pt was asked if she would like to start taking orillisa for her endometriosis pain. Pt stated that she would think about it and speak more with [facility] during their telephone visit this evening.

## 2018-09-12 NOTE — Progress Notes (Signed)
Pt was transferred from front desk via telephone visit. Pt stated that she was having a lot of pain from her endometriosis. Pt stated that medication seems to not be helping. Vital signs and medication was reviewed and updated.

## 2018-09-13 ENCOUNTER — Encounter: Payer: Self-pay | Admitting: Obstetrics and Gynecology

## 2018-10-09 ENCOUNTER — Telehealth: Payer: Self-pay | Admitting: Obstetrics and Gynecology

## 2018-10-09 NOTE — Telephone Encounter (Signed)
Patient is calling because she is in severe pain and is questioning if they have lifted the surgery yet. She is having some bloating and she is urinating very little. Would you like to see the patient in the office?

## 2018-10-09 NOTE — Telephone Encounter (Signed)
The patient called and stated she would like to speak with Dr. Oretha Milch nurse. I informed her that Geraldo Pitter is not in today, and her message will be routed to a different nurse. The patient stated that would be fine. Please advise.

## 2018-10-10 NOTE — Telephone Encounter (Signed)
Yes, if she is still significantly hurting we can see her in the office.  Currently the surgery restrictions are out until June 1st.

## 2018-10-11 NOTE — Telephone Encounter (Signed)
Lm for patient to return call to schedule appointment with Dr. Valentino Saxon.

## 2018-10-15 NOTE — Telephone Encounter (Signed)
Patient is scheduled for appt with Dr. Valentino Saxon.

## 2018-10-16 ENCOUNTER — Encounter: Payer: Self-pay | Admitting: Obstetrics and Gynecology

## 2018-10-16 ENCOUNTER — Ambulatory Visit: Payer: BLUE CROSS/BLUE SHIELD | Admitting: Obstetrics and Gynecology

## 2018-10-16 ENCOUNTER — Other Ambulatory Visit: Payer: Self-pay

## 2018-10-16 VITALS — BP 135/72 | HR 70 | Ht 70.0 in | Wt 276.8 lb

## 2018-10-16 DIAGNOSIS — R34 Anuria and oliguria: Secondary | ICD-10-CM

## 2018-10-16 DIAGNOSIS — R309 Painful micturition, unspecified: Secondary | ICD-10-CM | POA: Diagnosis not present

## 2018-10-16 DIAGNOSIS — R112 Nausea with vomiting, unspecified: Secondary | ICD-10-CM

## 2018-10-16 DIAGNOSIS — R102 Pelvic and perineal pain: Secondary | ICD-10-CM | POA: Diagnosis not present

## 2018-10-16 DIAGNOSIS — N898 Other specified noninflammatory disorders of vagina: Secondary | ICD-10-CM

## 2018-10-16 DIAGNOSIS — Z8742 Personal history of other diseases of the female genital tract: Secondary | ICD-10-CM

## 2018-10-16 DIAGNOSIS — N840 Polyp of corpus uteri: Secondary | ICD-10-CM

## 2018-10-16 LAB — POCT URINALYSIS DIPSTICK
Bilirubin, UA: NEGATIVE
Glucose, UA: NEGATIVE
Ketones, UA: NEGATIVE
Leukocytes, UA: NEGATIVE
Nitrite, UA: NEGATIVE
Protein, UA: NEGATIVE
Spec Grav, UA: >=1.03 — AB (ref 1.010–1.025)
Urobilinogen, UA: 0.2 E.U./dL
pH, UA: 6 (ref 5.0–8.0)

## 2018-10-16 MED ORDER — HYDROCODONE-ACETAMINOPHEN 5-325 MG PO TABS: 1.0000 | ORAL_TABLET | Freq: Four times a day (QID) | ORAL | 0 refills | Status: DC | PRN

## 2018-10-16 MED ORDER — MEDROXYPROGESTERONE ACETATE 10 MG PO TABS: 10.0000 mg | ORAL_TABLET | Freq: Every day | ORAL | 0 refills | Status: DC

## 2018-10-16 MED ORDER — ONDANSETRON 4 MG PO TBDP: 4.0000 mg | ORAL_TABLET | Freq: Four times a day (QID) | ORAL | 0 refills | Status: DC | PRN

## 2018-10-16 NOTE — Progress Notes (Signed)
Pt is present today due to having discharge with odor, pain with urination and pain in her legs. Pt stated that she has noticed that she has gone all day without urination and when she do go she has to push it out.

## 2018-10-16 NOTE — Progress Notes (Signed)
GYNECOLOGY PROGRESS NOTE  Subjective:    Patient ID: Marie Booker, female    DOB: 01-18-1966, 53 y.o.   MRN: [id number]  HPI  Patient is a 53 y.o. [place] female with a PMH of endometriosis and current endometrial polyp and PMB with prior surgical history of endometrial ablationwho presents for complaints of continued pelvic pain, mostly left-sided that has been progressively getting worse over the past several months. The pain is now radiating down her left leg. She notes that she is up to taking ~ 8 Tramadol for pain per day as well as taking Toradol.  She states that she is nauseated most of the day with occasional vomiting and is not taking in much food or drink.  She is concerned as she has noticed that some days she will go most of the day without urinating, and when she does void, she has to "push it out".  She reports weight gain despite loss of appetite. She is also complaining of vaginal discharge with odor for the past several days.  She reports that is is prone to skin yeast infections (under the breast, in the groin folds).  Recently took a Diflucan 2 weeks ago as she thought she might be getting a rash. She does note regular bowel movements, last BM last night.   The following portions of the patient's history were reviewed and updated as appropriate: allergies, current medications, past family history, past medical history, past social history, past surgical history and problem list.  Review of Systems Pertinent items noted in HPI and remainder of comprehensive ROS otherwise negative.   Objective:   Blood pressure 135/72, pulse 70, height 5\' 10"  (1.778 m), weight 276 lb 12.8 oz (125.6 kg), last menstrual period 10/31/2016. Body mass index is 39.72 kg/m.  General appearance: alert and mild distress Abdomen: normal findings: bowel sounds normal, no masses palpable and soft and abnormal findings:  mild to moderate tenderness in LLQ and LUQ Pelvic: external genitalia normal,  rectovaginal septum normal.  Vagina with scant thin white discharge, no odor.  Cervix normal appearing, no lesions and no motion tenderness.  Uterus mobile, nontender, normal shape and size, however difficult exam due to patient's discomfort with exam.  Adnexae non-palpable, but tender on left.      Labs:  Results for orders placed or performed in visit on 10/16/18  POCT urinalysis dipstick  Result Value Ref Range   Color, UA yellow    Clarity, UA clear    Glucose, UA Negative Negative   Bilirubin, UA neg    Ketones, UA neg    Spec Grav, UA >=1.030 (A) 1.010 - 1.025   Blood, UA mod    pH, UA 6.0 5.0 - 8.0   Protein, UA Negative Negative   Urobilinogen, UA 0.2 0.2 or 1.0 E.U./dL   Nitrite, UA neg    Leukocytes, UA Negative Negative   Appearance yellow    Odor       Microscopic wet-mount exam shows negative for pathogens, normal epithelial cells, KOH done.  Assessment:   Pelvic pain  Pain with urination  Decreased urination  History of endometriosis  Endometrial polyp  Nausea and vomiting in adult  Vaginal discharge  Plan:   - Patient noting pain that has increased affecting her quality of life. Notes that she is no longer to be intimate with her husband or perform usual daily activities.  States that all she has been doing for the past several weeks is take pain medications  and sleep. Patient notes that she understands that due to COVID pandemic that elective surgeries have been delayed until June, however now feels that her symptoms warrant more urgent management.  I discussed that I can attempt to see if her surgery can be scheduled prior to this date, however based on her negative imaging over the past few months and that she has never had any urgent/emergent visits outside of the office that it may be difficult to schedule.  I also discussed the type of surgery that patient desires to have. Initially was to be scheduled for a D&C and polypectomy, however due to pain,  discussed additional options of laparoscopy with possible lysis of adhesions or removal of ovary or endometriosis implants if present. Also discussed that if pain is severe enough and with her history of endometrial ablation and the possibility of continued pelvic pain after her D&C.  Patient notes that she is willing to consider hysterectomy.  - Will discontinue patient's Toradol (as she is also taking Mobic twice daily), and change to Norco to better manage pain.  Also advised to limit further Tramadol. Will prescribe Zofran for nausea/vomiting.  - Encouraged patient to continue attempts to hydrate. No evidence of ketonuria but patient does have an elevated specific gravity. No evidence of UTI.  - Vaginal discharge likely physiologic, no evidence of infection noted today. Given reassurance.    A total of 25 minutes were spent face-to-face with the patient during this encounter and over half of that time involved counseling and coordination of care.   Hildred Laserherry, Zakee Deerman, MD [facility]

## 2018-10-24 ENCOUNTER — Telehealth: Payer: Self-pay | Admitting: Obstetrics and Gynecology

## 2018-10-24 NOTE — Telephone Encounter (Signed)
Pt called to speak with nurse in regards to her results. Please advise.

## 2018-10-24 NOTE — Telephone Encounter (Signed)
Pt was informed that [facility] did a yeast test which is done in the office. Pt was questioning about having surgery. Pt was informed that her message would be sent to AC/

## 2018-10-30 ENCOUNTER — Telehealth: Payer: Self-pay

## 2018-10-30 ENCOUNTER — Encounter: Payer: BLUE CROSS/BLUE SHIELD | Admitting: Obstetrics and Gynecology

## 2018-10-30 NOTE — Telephone Encounter (Signed)
Pt called no answer LM to call the office to make an appointment for pre-op for surgery.

## 2018-11-01 ENCOUNTER — Telehealth: Payer: Self-pay

## 2018-11-01 NOTE — Telephone Encounter (Signed)
Pt called to see if she received the message that was left on Wednesday. Pt stated that she did. Pt was asked if could come in today for a pre-op visit with AC. Pt stated that she couldn't because she had her grandchildren. Pt was asked if she could come in one day next week. Then pt stated that she had lost parts of her health insurance because she was not working and needed to contact them to see what they would cover. Pt was advised as soon as she finds out to call and make that preop appointment with [facility] for surgery. Pt stated that she understood.

## 2018-12-15 IMAGING — CT CT RENAL STONE PROTOCOL
2 of 4 series · 16 of 46 positions shown, 18 images · non-contrast
Comparison: CT 09/20/2014

CLINICAL DATA: Left flank pain.

EXAM:
CT ABDOMEN AND PELVIS WITHOUT CONTRAST
TECHNIQUE: Multidetector CT imaging of the abdomen and pelvis was performed
following the standard protocol without IV contrast.

[Series 2: axial st · axial · 0.98mm/px · z∈[-404,+36]mm · 13 of 100 slices shown, 15 images]
[im 6/100  soft-tissue]
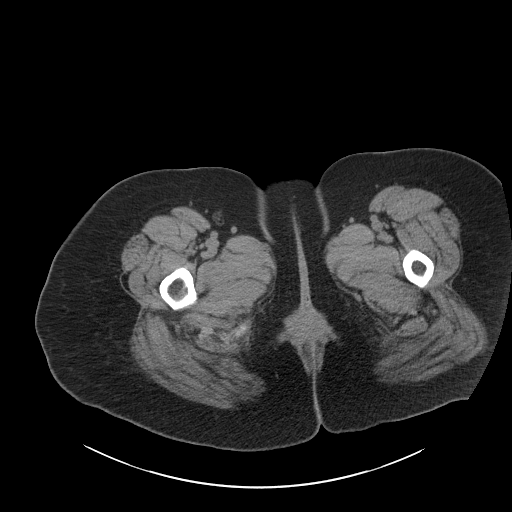
[im 6/100  bone]
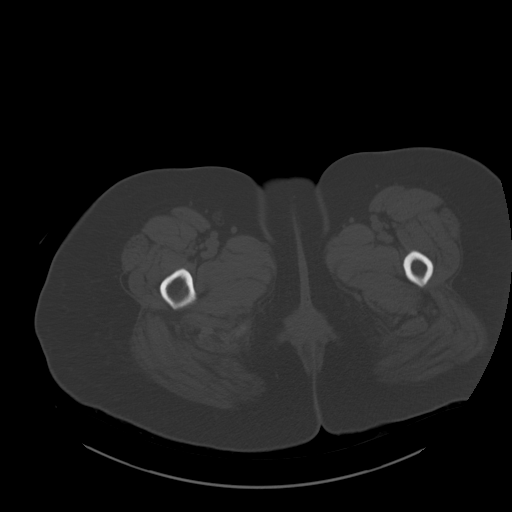
[im 16/100  soft-tissue]
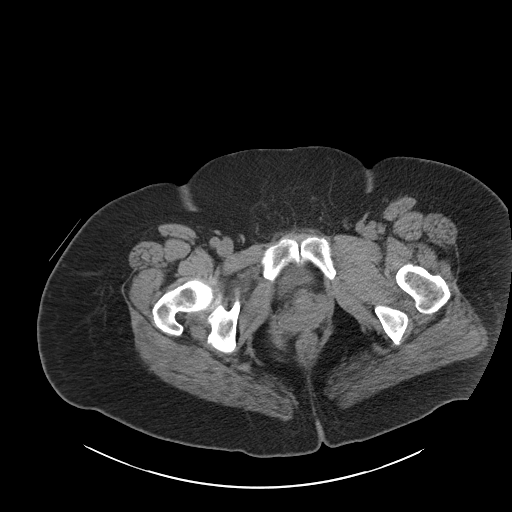
[im 21/100  soft-tissue]
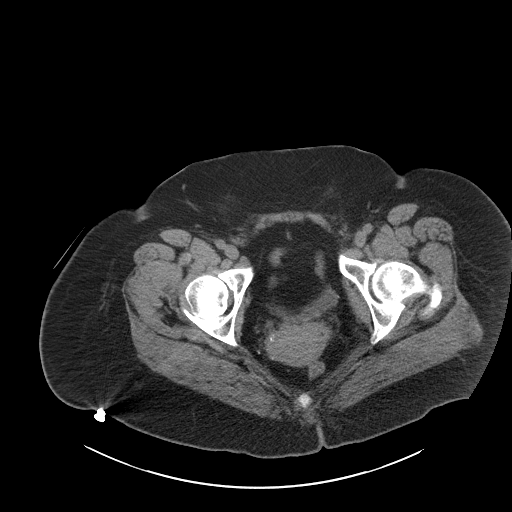
[im 27/100  soft-tissue]
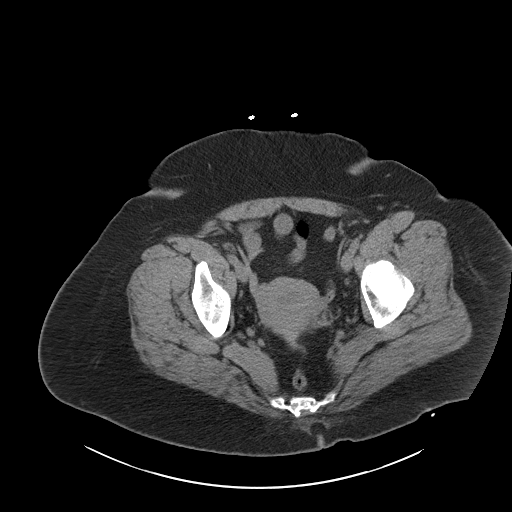
[im 37/100  soft-tissue]
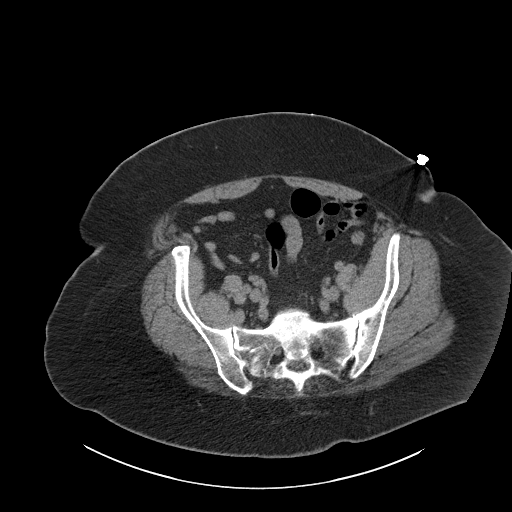
[im 42/100  soft-tissue]
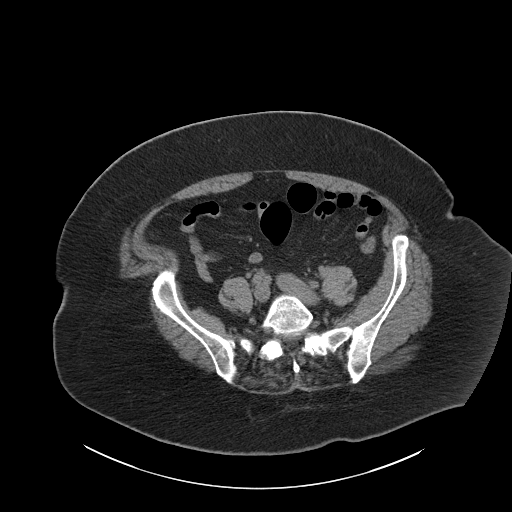
[im 53/100  soft-tissue]
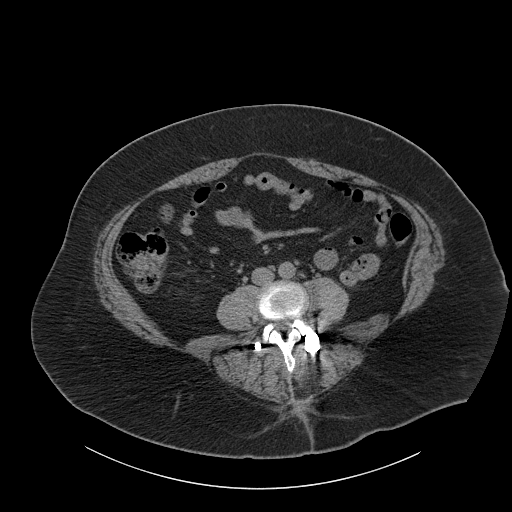
[im 58/100  soft-tissue]
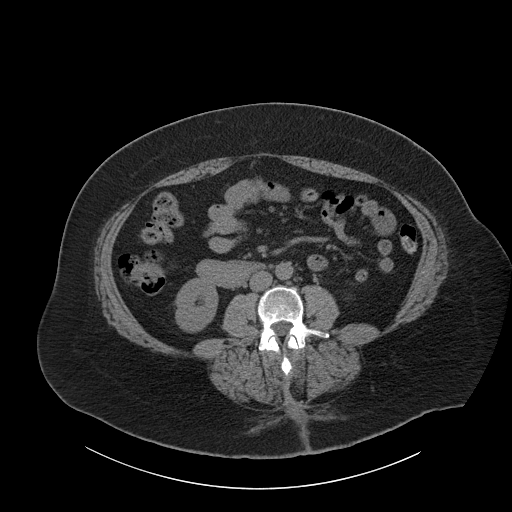
[im 63/100  soft-tissue]
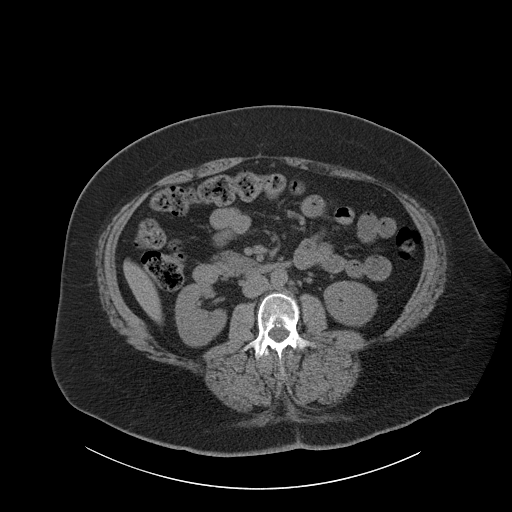
[im 63/100  bone]
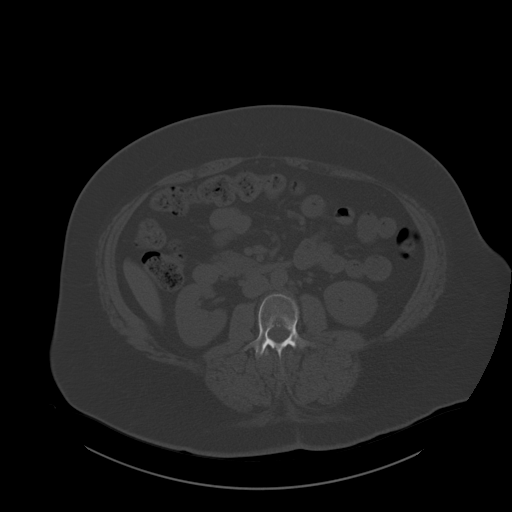
[im 73/100  soft-tissue]
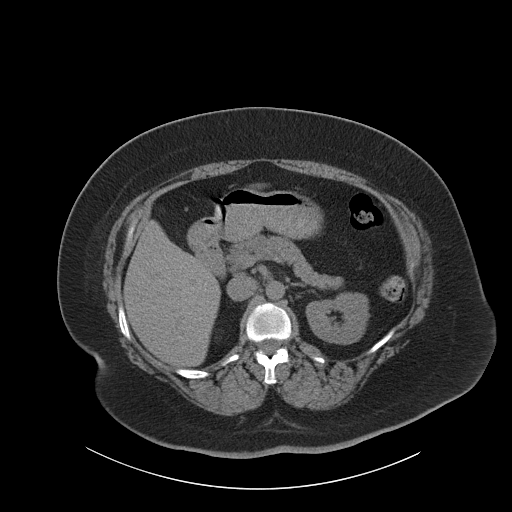
[im 79/100  soft-tissue]
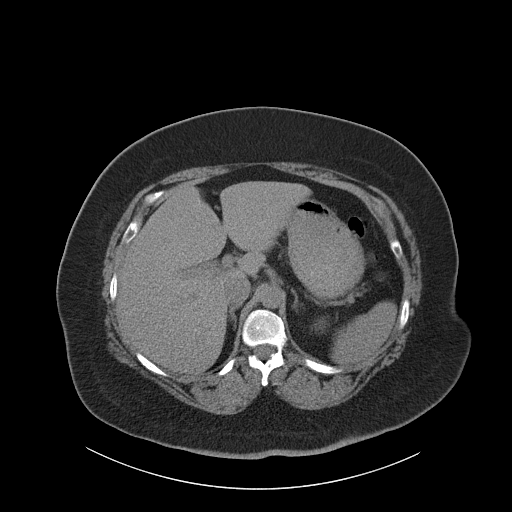
[im 84/100  soft-tissue]
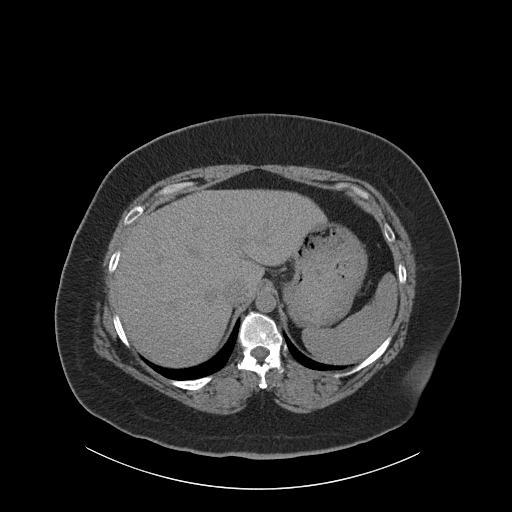
[im 94/100  soft-tissue]
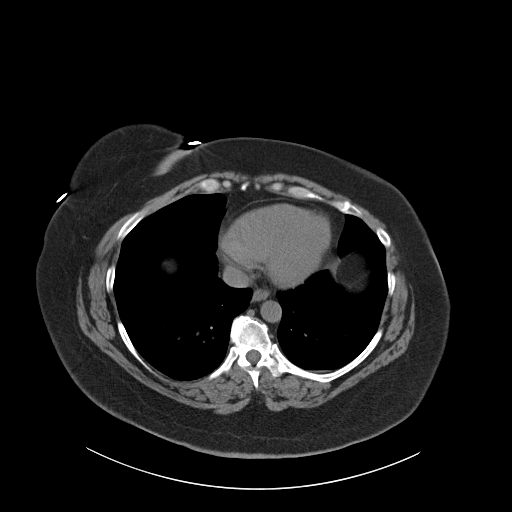

[Series 4: coronal · coronal · 0.83mm/px · 3 of 172 slices shown]
[im 58/172  soft-tissue]
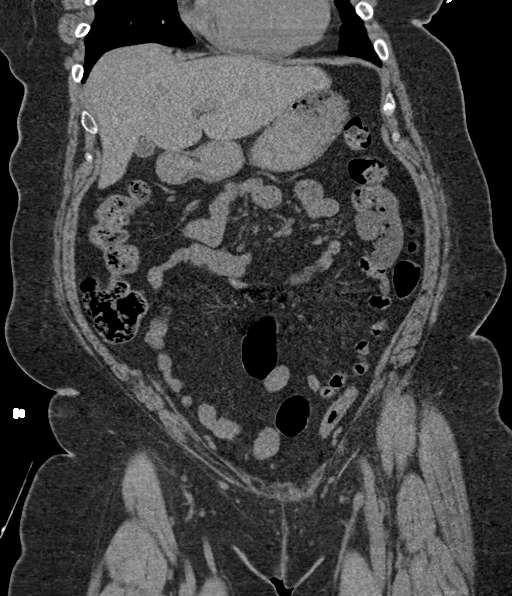
[im 77/172  soft-tissue]
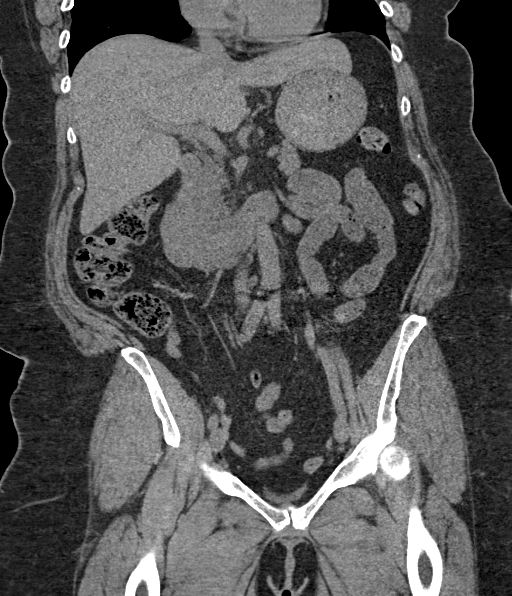
[im 96/172  soft-tissue]
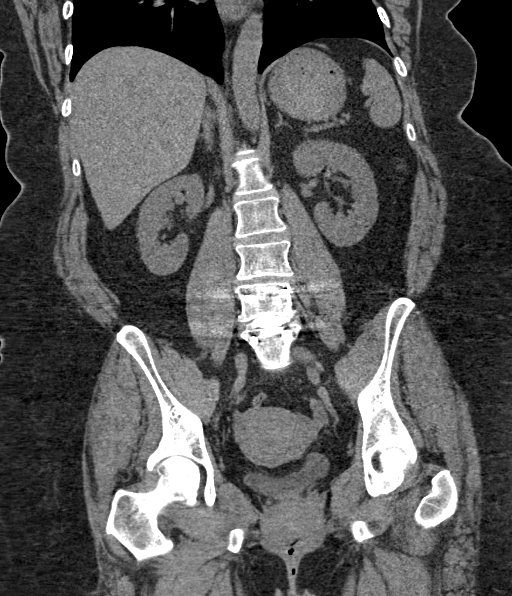

[16 of 46 positions shown; findings below may reference images not displayed]

FINDINGS: Lower chest: The lung bases are clear.

Hepatobiliary: No focal hepatic lesion allowing for lack contrast.
Gallbladder is partially distended. No calcified gallstone,
pericholecystic inflammation or biliary dilatation.

Pancreas: No ductal dilatation or inflammation.

Spleen: Normal in size without focal abnormality.

Adrenals/Urinary Tract: No adrenal nodule. No renal stones or
hydronephrosis. No perinephric edema. Unenhanced kidneys are
unremarkable. Both ureters are decompressed without stones along the
course. Urinary bladder is nondistended and not well evaluated. No
bladder stone or wall thickening. Pelvic calcifications are
phleboliths and unchanged from prior exam.

Stomach/Bowel: Stomach physiologically distended with ingested
contents. No bowel wall thickening, inflammation or obstruction.
Appendix not identified, no pericecal or right lower quadrant
inflammation. Moderate colonic stool burden in the proximal colon
without colonic wall thickening or inflammatory change. No
significant diverticular disease.

Vascular/Lymphatic: Normal caliber abdominal aorta. Small
retroperitoneal nodes not enlarged by size criteria. No enlarged
abdominal or pelvic nodes.

Reproductive: Uterus and bilateral adnexa are unremarkable.

Other: Small fat containing umbilical hernia. No free air, free
fluid, or intra-abdominal fluid collection.

Musculoskeletal: Posterior L4-L5 fusion. There are no acute or
suspicious osseous abnormalities.
IMPRESSION: 1. No renal stones or obstructive uropathy.
2. No acute abnormality in the abdomen/pelvis or explanation for
left flank pain.

## 2019-01-21 ENCOUNTER — Other Ambulatory Visit: Payer: Self-pay

## 2019-01-21 ENCOUNTER — Ambulatory Visit: Payer: BLUE CROSS/BLUE SHIELD | Admitting: Obstetrics and Gynecology

## 2019-01-21 ENCOUNTER — Encounter: Payer: Self-pay | Admitting: Obstetrics and Gynecology

## 2019-01-21 VITALS — BP 126/78 | HR 102 | Ht 70.0 in | Wt 276.0 lb

## 2019-01-21 DIAGNOSIS — R102 Pelvic and perineal pain: Secondary | ICD-10-CM | POA: Diagnosis not present

## 2019-01-21 DIAGNOSIS — N840 Polyp of corpus uteri: Secondary | ICD-10-CM

## 2019-01-21 DIAGNOSIS — N393 Stress incontinence (female) (male): Secondary | ICD-10-CM

## 2019-01-21 DIAGNOSIS — Z8742 Personal history of other diseases of the female genital tract: Secondary | ICD-10-CM

## 2019-01-21 DIAGNOSIS — N950 Postmenopausal bleeding: Secondary | ICD-10-CM

## 2019-01-21 DIAGNOSIS — Z6839 Body mass index (BMI) 39.0-39.9, adult: Secondary | ICD-10-CM

## 2019-01-21 NOTE — H&P (View-Only) (Signed)
@[email or web address]@   GYNECOLOGY PREOPERATIVE HISTORY AND PHYSICAL   Subjective:  Marie Booker is a 53 y.o. G3P3 here for surgical management of pelvic pain (mostly left sided), history of endometriosis, postmenopausal bleeding, endometrial polyp, and stress urinary incontinence.  Preoperative concerns include: moderate obesity (BMI 39) and obstructive sleep apnea.  Proposed surgery: LAVH/BSO with possible anterior repair, urethral sling placement, and cystoscopy.    Pertinent Gynecological History: Menses: post-menopausal Bleeding: post menopausal bleeding Contraception: post menopausal status Last mammogram: normal Date: 12/2014 Last pap: normal Date: 11/15/2017   Past Medical History:  Diagnosis Date  . Anemia    iron infusion 4 years ago due to heavy periods  . Arthritis   . Asthma   . Family history of adverse reaction to anesthesia    mom-hard time waking up  . Hypothyroidism   . Pre-diabetes   . RLS (restless legs syndrome)   . Sleep apnea   . Thyroid disease    Past Surgical History:  Procedure Laterality Date  . APPENDECTOMY    . BACK SURGERY     lower  . CARPAL TUNNEL RELEASE Left 11/07/2017   Procedure: CARPAL TUNNEL RELEASE;  Surgeon: Miller, Howard, MD;  Location: [facility] ORS;  Service: Orthopedics;  Laterality: Left;  . ENDOMETRIAL ABLATION    . LEG SURGERY      OB History  Gravida Para Term Preterm AB Living  3 3       3  SAB TAB Ectopic Multiple Live Births               # Outcome Date GA Lbr Len/2nd Weight Sex Delivery Anes PTL Lv  3 Para 1990     Vag-Spont     2 Para 1988     Vag-Spont     1 Para 1986     Vag-Spont       Family History  Problem Relation Age of Onset  . COPD Mother     Social History   Socioeconomic History  . Marital status: Divorced    Spouse name: Not on file  . Number of children: Not on file  . Years of education: Not on file  . Highest education level: Not on file  Occupational History  . Not on file  Social Needs  .  Financial resource strain: Not on file  . Food insecurity    Worry: Not on file    Inability: Not on file  . Transportation needs    Medical: Not on file    Non-medical: Not on file  Tobacco Use  . Smoking status: Never Smoker  . Smokeless tobacco: Never Used  Substance and Sexual Activity  . Alcohol use: No  . Drug use: No  . Sexual activity: Not Currently    Birth control/protection: Post-menopausal  Lifestyle  . Physical activity    Days per week: Not on file    Minutes per session: Not on file  . Stress: Not on file  Relationships  . Social connections    Talks on phone: Not on file    Gets together: Not on file    Attends religious service: Not on file    Active member of club or organization: Not on file    Attends meetings of clubs or organizations: Not on file    Relationship status: Not on file  . Intimate partner violence    Fear of current or ex partner: Not on file    Emotionally abused: Not on file      Physically abused: Not on file    Forced sexual activity: Not on file  Other Topics Concern  . Not on file  Social History Narrative  . Not on file    Current Outpatient Medications on File Prior to Visit  Medication Sig Dispense Refill  . ALPRAZolam (XANAX) 0.5 MG tablet Take 0.5 mg by mouth daily as needed for anxiety.    . conjugated estrogens (PREMARIN) vaginal cream Place 1 Applicatorful vaginally 3 (three) times a week. Apply dime size amount to urethra (vaginal introitus) 2-3 times weekly 42.5 g 4  . cyanocobalamin (,VITAMIN B-12,) 1000 MCG/ML injection Inject 1 mL (1,000 mcg total) into the muscle every 30 (thirty) days. 10 mL 1  . HYDROcodone-acetaminophen (NORCO) 5-325 MG tablet Take 1-2 tablets by mouth every 6 (six) hours as needed. 30 tablet 0  . miconazole (LOTRIMIN AF) 2 % powder Apply topically as needed for itching. 70 g 1  . nystatin-triamcinolone ointment (MYCOLOG) Apply 1 application topically 2 (two) times daily. 30 g 0  . ondansetron  (ZOFRAN ODT) 4 MG disintegrating tablet Take 1 tablet (4 mg total) by mouth every 6 (six) hours as needed for nausea. 20 tablet 0  . rOPINIRole (REQUIP) 2 MG tablet Take 2 mg by mouth at bedtime.    . sertraline (ZOLOFT) 100 MG tablet Take 100 mg by mouth at bedtime.     Marland Kitchen thyroid (ARMOUR) 120 MG tablet Take 180 mg by mouth daily before breakfast.     . traMADol (ULTRAM) 50 MG tablet Take 50 mg by mouth 4 (four) times daily as needed for severe pain.     . Vitamin D, Ergocalciferol, (DRISDOL) 50000 units CAPS capsule Take 50,000 Units by mouth 2 (two) times a week.    Marland Kitchen albuterol (PROVENTIL HFA) 108 (90 Base) MCG/ACT inhaler Inhale 1 puff into the lungs daily as needed for allergies.    . Clindamycin Phosphate, 1 Dose, vaginal cream Apply 1 Applicatorful topically 2 (two) times daily. (Patient not taking: Reported on 08/13/2018) 5.8 g 1  . fluconazole (DIFLUCAN) 150 MG tablet Take 1 tablet (150 mg total) by mouth every 3 (three) days. For three doses (Patient not taking: Reported on 08/13/2018) 3 tablet 3  . fluconazole (DIFLUCAN) 150 MG tablet Take 1 tablet (150 mg total) by mouth every 3 (three) days. (Patient not taking: Reported on 10/16/2018) 2 tablet 1  . gabapentin (NEURONTIN) 400 MG capsule Take 1 capsule (400 mg total) by mouth 2 (two) times daily. (Patient not taking: Reported on 11/15/2017) 60 capsule 3  . medroxyPROGESTERone (PROVERA) 10 MG tablet Take 1 tablet (10 mg total) by mouth daily. (Patient not taking: Reported on 01/21/2019) 30 tablet 0  . methocarbamol (ROBAXIN-750) 750 MG tablet Take 750 mg by mouth every 6 (six) hours as needed.     . phentermine (ADIPEX-P) 37.5 MG tablet Take 1 tablet (37.5 mg total) by mouth daily before breakfast. (Patient not taking: Reported on 01/21/2019) 30 tablet 2   No current facility-administered medications on file prior to visit.     Allergies  Allergen Reactions  . Dilaudid [Hydromorphone Hcl] Itching     Review of Systems Constitutional: No  recent fever/chills/sweats Respiratory: No recent cough/bronchitis Cardiovascular: No chest pain Gastrointestinal: No recent nausea/vomiting/diarrhea Genitourinary: No UTI symptoms Hematologic/lymphatic:No history of coagulopathy or recent blood thinner use    Objective:   Blood pressure 126/78, pulse (!) 102, height 5\' 10"  (1.778 m), weight 276 lb (125.2 kg), last menstrual period 10/31/2016. CONSTITUTIONAL: Well-developed, well-nourished  female in no acute distress.  HENT:  Normocephalic, atraumatic, External right and left ear normal. Oropharynx is clear and moist EYES: Conjunctivae and EOM are normal. Pupils are equal, round, and reactive to light. No scleral icterus.  NECK: Normal range of motion, supple, no masses SKIN: Skin is warm and dry. No rash noted. Not diaphoretic. No erythema. No pallor. NEUROLOGIC: Alert and oriented to person, place, and time. Normal reflexes, muscle tone coordination. No cranial nerve deficit noted. PSYCHIATRIC: Normal mood and affect. Normal behavior. Normal judgment and thought content. CARDIOVASCULAR: Normal heart rate noted, regular rhythm RESPIRATORY: Effort and breath sounds normal, no problems with respiration noted ABDOMEN: Soft, tender in LLQ, nondistended. PELVIC: Deferred MUSCULOSKELETAL: Normal range of motion. No edema and no tenderness. 2+ distal pulses.    Labs: No results found for this or any previous visit (from the past 336 hour(s)).  Imaging Studies: No results found.  Assessment:      ICD-10-CM   1. Pelvic pain in female  R10.2   2. History of endometriosis  Z87.42   3. Endometrial polyp  N84.0   4. PMB (postmenopausal bleeding)  N95.0   5. BMI 39.0-39.9,adult  Z68.39   6. Stress incontinence of urine  N39.3     Plan:    Counseling: Procedure, risks, reasons, benefits and complications (including injury to bowel, bladder, major blood vessel, ureter, bleeding, possibility of transfusion, infection, or fistula  formation) reviewed in detail. Likelihood of success in alleviating the patient's condition was discussed. Routine postoperative instructions will be reviewed with the patient and her family in detail after surgery.  The patient concurred with the proposed plan, giving informed written consent for the surgery.   Preop testing ordered. Instructions reviewed, including NPO after midnight.       Hildred Laserherry, Porshia Blizzard, MD [facility]

## 2019-01-21 NOTE — Progress Notes (Signed)
Pt is present today for a follow up for surgery consults. Pt stated that she is in a lot of pain everyday.

## 2019-01-21 NOTE — Progress Notes (Signed)
GYNECOLOGY PROGRESS NOTE  Subjective:    Patient ID: Marie Booker, female    DOB: [birth year], 53 y.o.   MRN: [id number]  HPI  Patient is a 53 y.o. G3P3 female who presents for further discussion of surgical management. She has a a PMH of endometriosis and current endometrial polyp and PMB with prior surgical history of endometrial ablation who has continued complaints of pelvic pain, mostly left-sided that has been progressively getting worse over the past several months. The pain is now radiating down her left leg, and most days is associated with significant nausea. She notes that she is tired of taking pain medication and would like to have surgical intervention. She also has stress urinary incontinence, with leakage of urine on coughing, sneezing, laughing. Notes that she would like to consider a hysterectomy.   The following portions of the patient's history were reviewed and updated as appropriate: allergies, current medications, past family history, past medical history, past social history, past surgical history and problem list.  Review of Systems Pertinent items noted in HPI and remainder of comprehensive ROS otherwise negative.   Objective:   Blood pressure 126/78, pulse (!) 102, height 5\' 10"  (1.778 m), weight 276 lb (125.2 kg), last menstrual period 10/31/2016.  Body mass index is 39.6 kg/m.  General appearance: alert, no distress and moderately obese Abdomen: normal findings: bowel sounds normal, no masses palpable and soft and abnormal findings:  mild to moderate tenderness in LLQ and LUQ Pelvic: external genitalia normal, rectovaginal septum normal.  Vagina with scant thin white discharge, no odor. Grade 1 cystocele present.   Cervix normal appearing, no lesions and no motion tenderness.  Uterus mobile, nontender, normal shape and size, however difficult exam due to patient's discomfort with exam.  Adnexae non-palpable, but tender on left.      Imaging:  US Transvaginal  Non-OB Patient Name: Marie Booker DOB: [birth year] MRN: [id number]  ULTRASOUND REPORT  Location: [facility]   Date of Service: 04/18/2018   Indications:Dyspareunia Findings:  The uterus is anteverted and measures 8.8x4.1x4.2cm. Echo texture is homogenous without evidence of focal masses. The Endometrium measures 6.4 mm.  Right Ovary measures not seen d/t filled bowel Left Ovary measures 1x1.1x1.2cm. It is normal in appearance. Survey of the adnexa demonstrates no adnexal masses. There is no free fluid in the cul de sac.  Impression: 1. Normal GYN US exam 2. Right Ovary measures not seen d/t filled bowel  Recommendations: 1.Clinical correlation with the patient's History and Physical Exam.  Abeer Alsammarraie, RDMS  The ultrasound images and findings were reviewed by me and I agree with  the above report.  Elonda Huskyavid J. Evans, M.D. 04/23/2018 12:49 PM   Assessment:   1. Pelvic pain in female   2. History of endometriosis   3. Endometrial polyp   4. PMB (postmenopausal bleeding)   5. BMI 39.0-39.9,adult   6. Stress incontinence of urine     Plan:    - Patient noting pain that has increased affecting her quality of life. Notes that she is no longer to be intimate with her husband or perform usual daily activities.  Is tired of having to take pain medications (although also uses pain meds for chronic back pain, for which she notes she is also going to have to have surgery for soon). Has a history of endometriosis and endometrial polyp and PMB as well.  Has previously been counseled on all management options. Patient desired a hysterectomy back in April,  however due to COVID, surgery was delayed. In May patient states she lost hr insurance, but recently just obtained new insurance so would like to proceed.  - Patient desires definitive management with hysterectomy.  I proposed doing a laparoscopic-assisted vaginal hysterectomy (LAVH) with bilateral salpingo-oophorectomy  (BSO).  Patient agrees with this proposed surgery.  The risks of surgery were discussed in detail with the patient including but not limited to: bleeding which may require transfusion or reoperation; infection which may require antibiotics; injury to bowel, bladder, ureters or other surrounding organs; need for additional procedures including laparotomy; thromboembolic phenomenon, incisional problems and other postoperative/anesthesia complications.  Patient was also advised that she will remain in house for 1-2 nights; and expected recovery time after a hysterectomy is 6-8 weeks.  Patient was told that the likelihood that her condition and symptoms will be treated effectively with this surgical management was very high; the postoperative expectations were also discussed in detail. The patient also understands the alternative treatment options which were discussed in full. All questions were answered.  She was told that she will be contacted by our surgical scheduler regarding the time and date of her surgery; routine preoperative instructions will be given to her by the preoperative nursing team.   She is aware of need for preoperative COVID testing and subsequent quarantine from time of test to time of surgery; she will be given further preoperative instructions at that [place] screening visit.   Routine postoperative instructions will be reviewed with the patient in detail after surgery.  Printed patient education handouts about the procedure was given to the patient to review at home. - Urinary incontinence - discussed with patient that incontinence could possibly improve with no further intervention after hysterectomy, or may still remain. Also discussed the option of urinary sling placement to help with stress incontinence as she still has other risk factors including moderate obesity, limited mobility at times (due to chronic back and pelvic pain currently). Has tried Kegel exercises in the past with limited  success. Patient ok with plan. Will need cystoscopy performed post-procedure.  - To schedule for LAVH/BSO with possible anterior repair and urinary sling placement on 01/10/2019.  Handouts given.    [name], MD [facility]

## 2019-01-21 NOTE — H&P (Signed)
 @[facility] @   GYNECOLOGY PREOPERATIVE HISTORY AND PHYSICAL   Subjective:  Jerry CarasWendy S Borchardt is a 53 y.o. G3P3 here for surgical management of pelvic pain (mostly left sided), history of endometriosis, postmenopausal bleeding, endometrial polyp, and stress urinary incontinence.  Preoperative concerns include: moderate obesity (BMI 39) and obstructive sleep apnea.  Proposed surgery: LAVH/BSO with possible anterior repair, urethral sling placement, and cystoscopy.    Pertinent Gynecological History: Menses: post-menopausal Bleeding: post menopausal bleeding Contraception: post menopausal status Last mammogram: normal Date: 12/2014 Last pap: normal Date: 11/15/2017   Past Medical History:  Diagnosis Date  . Anemia    iron infusion 4 years ago due to heavy periods  . Arthritis   . Asthma   . Family history of adverse reaction to anesthesia    mom-hard time waking up  . Hypothyroidism   . Pre-diabetes   . RLS (restless legs syndrome)   . Sleep apnea   . Thyroid disease    Past Surgical History:  Procedure Laterality Date  . APPENDECTOMY    . BACK SURGERY     lower  . CARPAL TUNNEL RELEASE Left 11/07/2017   Procedure: CARPAL TUNNEL RELEASE;  Surgeon: Deeann SaintMiller, Howard, MD;  Location: [facility] ORS;  Service: Orthopedics;  Laterality: Left;  . ENDOMETRIAL ABLATION    . LEG SURGERY      OB History  Gravida Para Term Preterm AB Living  3 3       3   SAB TAB Ectopic Multiple Live Births               # Outcome Date GA Lbr Len/2nd Weight Sex Delivery Anes PTL Lv  3 Para 1990     Vag-Spont     2 Para 1988     Vag-Spont     1 Para 1986     Vag-Spont       Family History  Problem Relation Age of Onset  . COPD Mother     Social History   Socioeconomic History  . Marital status: Divorced    Spouse name: Not on file  . Number of children: Not on file  . Years of education: Not on file  . Highest education level: Not on file  Occupational History  . Not on file  Social Needs  .  Financial resource strain: Not on file  . Food insecurity    Worry: Not on file    Inability: Not on file  . Transportation needs    Medical: Not on file    Non-medical: Not on file  Tobacco Use  . Smoking status: Never Smoker  . Smokeless tobacco: Never Used  Substance and Sexual Activity  . Alcohol use: No  . Drug use: No  . Sexual activity: Not Currently    Birth control/protection: Post-menopausal  Lifestyle  . Physical activity    Days per week: Not on file    Minutes per session: Not on file  . Stress: Not on file  Relationships  . Social Musicianconnections    Talks on phone: Not on file    Gets together: Not on file    Attends religious service: Not on file    Active member of club or organization: Not on file    Attends meetings of clubs or organizations: Not on file    Relationship status: Not on file  . Intimate partner violence    Fear of current or ex partner: Not on file    Emotionally abused: Not on file  Physically abused: Not on file    Forced sexual activity: Not on file  Other Topics Concern  . Not on file  Social History Narrative  . Not on file    Current Outpatient Medications on File Prior to Visit  Medication Sig Dispense Refill  . ALPRAZolam (XANAX) 0.5 MG tablet Take 0.5 mg by mouth daily as needed for anxiety.    . conjugated estrogens (PREMARIN) vaginal cream Place 1 Applicatorful vaginally 3 (three) times a week. Apply dime size amount to urethra (vaginal introitus) 2-3 times weekly 42.5 g 4  . cyanocobalamin (,VITAMIN B-12,) 1000 MCG/ML injection Inject 1 mL (1,000 mcg total) into the muscle every 30 (thirty) days. 10 mL 1  . HYDROcodone-acetaminophen (NORCO) 5-325 MG tablet Take 1-2 tablets by mouth every 6 (six) hours as needed. 30 tablet 0  . miconazole (LOTRIMIN AF) 2 % powder Apply topically as needed for itching. 70 g 1  . nystatin-triamcinolone ointment (MYCOLOG) Apply 1 application topically 2 (two) times daily. 30 g 0  . ondansetron  (ZOFRAN ODT) 4 MG disintegrating tablet Take 1 tablet (4 mg total) by mouth every 6 (six) hours as needed for nausea. 20 tablet 0  . rOPINIRole (REQUIP) 2 MG tablet Take 2 mg by mouth at bedtime.    . sertraline (ZOLOFT) 100 MG tablet Take 100 mg by mouth at bedtime.     Marland Kitchen thyroid (ARMOUR) 120 MG tablet Take 180 mg by mouth daily before breakfast.     . traMADol (ULTRAM) 50 MG tablet Take 50 mg by mouth 4 (four) times daily as needed for severe pain.     . Vitamin D, Ergocalciferol, (DRISDOL) 50000 units CAPS capsule Take 50,000 Units by mouth 2 (two) times a week.    Marland Kitchen albuterol (PROVENTIL HFA) 108 (90 Base) MCG/ACT inhaler Inhale 1 puff into the lungs daily as needed for allergies.    . Clindamycin Phosphate, 1 Dose, vaginal cream Apply 1 Applicatorful topically 2 (two) times daily. (Patient not taking: Reported on 08/13/2018) 5.8 g 1  . fluconazole (DIFLUCAN) 150 MG tablet Take 1 tablet (150 mg total) by mouth every 3 (three) days. For three doses (Patient not taking: Reported on 08/13/2018) 3 tablet 3  . fluconazole (DIFLUCAN) 150 MG tablet Take 1 tablet (150 mg total) by mouth every 3 (three) days. (Patient not taking: Reported on 10/16/2018) 2 tablet 1  . gabapentin (NEURONTIN) 400 MG capsule Take 1 capsule (400 mg total) by mouth 2 (two) times daily. (Patient not taking: Reported on 11/15/2017) 60 capsule 3  . medroxyPROGESTERone (PROVERA) 10 MG tablet Take 1 tablet (10 mg total) by mouth daily. (Patient not taking: Reported on 01/21/2019) 30 tablet 0  . methocarbamol (ROBAXIN-750) 750 MG tablet Take 750 mg by mouth every 6 (six) hours as needed.     . phentermine (ADIPEX-P) 37.5 MG tablet Take 1 tablet (37.5 mg total) by mouth daily before breakfast. (Patient not taking: Reported on 01/21/2019) 30 tablet 2   No current facility-administered medications on file prior to visit.     Allergies  Allergen Reactions  . Dilaudid [Hydromorphone Hcl] Itching     Review of Systems Constitutional: No  recent fever/chills/sweats Respiratory: No recent cough/bronchitis Cardiovascular: No chest pain Gastrointestinal: No recent nausea/vomiting/diarrhea Genitourinary: No UTI symptoms Hematologic/lymphatic:No history of coagulopathy or recent blood thinner use    Objective:   Blood pressure 126/78, pulse (!) 102, height 5\' 10"  (1.778 m), weight 276 lb (125.2 kg), last menstrual period 10/31/2016. CONSTITUTIONAL: Well-developed, well-nourished  female in no acute distress.  HENT:  Normocephalic, atraumatic, External right and left ear normal. Oropharynx is clear and moist EYES: Conjunctivae and EOM are normal. Pupils are equal, round, and reactive to light. No scleral icterus.  NECK: Normal range of motion, supple, no masses SKIN: Skin is warm and dry. No rash noted. Not diaphoretic. No erythema. No pallor. NEUROLOGIC: Alert and oriented to person, place, and time. Normal reflexes, muscle tone coordination. No cranial nerve deficit noted. PSYCHIATRIC: Normal mood and affect. Normal behavior. Normal judgment and thought content. CARDIOVASCULAR: Normal heart rate noted, regular rhythm RESPIRATORY: Effort and breath sounds normal, no problems with respiration noted ABDOMEN: Soft, tender in LLQ, nondistended. PELVIC: Deferred MUSCULOSKELETAL: Normal range of motion. No edema and no tenderness. 2+ distal pulses.    Labs: No results found for this or any previous visit (from the past 336 hour(s)).  Imaging Studies: No results found.  Assessment:      ICD-10-CM   1. Pelvic pain in female  R10.2   2. History of endometriosis  Z87.42   3. Endometrial polyp  N84.0   4. PMB (postmenopausal bleeding)  N95.0   5. BMI 39.0-39.9,adult  Z68.39   6. Stress incontinence of urine  N39.3     Plan:    Counseling: Procedure, risks, reasons, benefits and complications (including injury to bowel, bladder, major blood vessel, ureter, bleeding, possibility of transfusion, infection, or fistula  formation) reviewed in detail. Likelihood of success in alleviating the patient's condition was discussed. Routine postoperative instructions will be reviewed with the patient and her family in detail after surgery.  The patient concurred with the proposed plan, giving informed written consent for the surgery.   Preop testing ordered. Instructions reviewed, including NPO after midnight.       Hildred Laserherry, Kilynn Fitzsimmons, MD [facility]

## 2019-02-05 ENCOUNTER — Telehealth: Payer: Self-pay | Admitting: Obstetrics and Gynecology

## 2019-02-05 NOTE — Telephone Encounter (Signed)
The patient called and stated that she needs to speak with her nurse in regards to her needing to ask a few questions. No other information was disclosed. Please advise.

## 2019-02-06 ENCOUNTER — Encounter
Admission: RE | Admit: 2019-02-06 | Discharge: 2019-02-06 | Disposition: A | Discharge status: Home / Self Care | Payer: BLUE CROSS/BLUE SHIELD | Source: Ambulatory Visit | Attending: Obstetrics and Gynecology | Admitting: Obstetrics and Gynecology

## 2019-02-06 ENCOUNTER — Other Ambulatory Visit: Payer: Self-pay

## 2019-02-06 DIAGNOSIS — Z20828 Contact with and (suspected) exposure to other viral communicable diseases: Secondary | ICD-10-CM | POA: Insufficient documentation

## 2019-02-06 DIAGNOSIS — Z01818 Encounter for other preprocedural examination: Secondary | ICD-10-CM | POA: Insufficient documentation

## 2019-02-06 DIAGNOSIS — G4730 Sleep apnea, unspecified: Secondary | ICD-10-CM | POA: Diagnosis not present

## 2019-02-06 HISTORY — DX: Anxiety disorder, unspecified: F41.9

## 2019-02-06 LAB — COMPREHENSIVE METABOLIC PANEL
ALT: 19 U/L (ref 0–44)
AST: 24 U/L (ref 15–41)
Albumin: 3.8 g/dL (ref 3.5–5.0)
Alkaline Phosphatase: 81 U/L (ref 38–126)
Anion gap: 7 (ref 5–15)
BUN: 17 mg/dL (ref 6–20)
CO2: 25 mmol/L (ref 22–32)
Calcium: 9.1 mg/dL (ref 8.9–10.3)
Chloride: 104 mmol/L (ref 98–111)
Creatinine, Ser: 1 mg/dL (ref 0.44–1.00)
GFR calc Af Amer: >60 mL/min (ref >60)
GFR calc non Af Amer: >60 mL/min (ref >60)
Glucose, Bld: 97 mg/dL (ref 70–99)
Potassium: 3.8 mmol/L (ref 3.5–5.1)
Sodium: 136 mmol/L (ref 135–145)
Total Bilirubin: 0.6 mg/dL (ref 0.3–1.2)
Total Protein: 6.9 g/dL (ref 6.5–8.1)

## 2019-02-06 LAB — TYPE AND SCREEN
ABO/RH(D): O POS
Antibody Screen: NEGATIVE

## 2019-02-06 LAB — CBC
HCT: 41.1 % (ref 36.0–46.0)
Hemoglobin: 13.4 g/dL (ref 12.0–15.0)
MCH: 29.3 pg (ref 26.0–34.0)
MCHC: 32.6 g/dL (ref 30.0–36.0)
MCV: 89.7 fL (ref 80.0–100.0)
Platelets: 258 10*3/uL (ref 150–400)
RBC: 4.58 MIL/uL (ref 3.87–5.11)
RDW: 13.2 % (ref 11.5–15.5)
WBC: 7.2 10*3/uL (ref 4.0–10.5)
nRBC: 0 % (ref 0.0–0.2)

## 2019-02-06 NOTE — Telephone Encounter (Signed)
Pt called back and spoke with nurse. Pt stated that she received a call from her PCP stating that she needs clearance to have her surgery. Pt was asked what did her PCP stated was wrong. Pt stated that she had an abnormal EKG reading. Pt also stated that she has an hx of getting staph infections. Pt was informed that her PCP will need to sign the form clearing her for surgery. Pt was advise to call her PCP and speak with them.

## 2019-02-06 NOTE — Patient Instructions (Signed)
Your procedure is scheduled on: 02/10/19 Report to Day Surgery.[facility]OND FLOOR To find out your arrival time please call [phone] between 1PM - 3PM on 02/07/19  Remember: Instructions that are not followed completely may result in serious medical risk,  up to and including death, or upon the discretion of your surgeon and anesthesiologist your  surgery may need to be rescheduled.     _X__ 1. Do not eat food after midnight the night before your procedure.                 No gum chewing or hard candies. You may drink clear liquids up to 2 hours                 before you are scheduled to arrive for your surgery- DO not drink clear                 liquids within 2 hours of the start of your surgery.                 Clear Liquids include:  water, apple juice without pulp, clear carbohydrate                 drink such as Clearfast of Gatorade, Black Coffee or Tea (Do not add                 anything to coffee or tea). COMPLETE G2 CARB DRINK 3 FULL HOURS BEFORE ARRIVAL TIME MORNING OF SURGERY  __X__2.  On the morning of surgery brush your teeth with toothpaste and water, you                may rinse your mouth with mouthwash if you wish.  Do not swallow any toothpaste of mouthwash.     _X__ 3.  No Alcohol for 24 hours before or after surgery.   _X__ 4.  Do Not Smoke or use e-cigarettes For 24 Hours Prior to Your Surgery.                 Do not use any chewable tobacco products for at least 6 hours prior to                 surgery.  ____  5.  Bring all medications with you on the day of surgery if instructed.   __X__  6.  Notify your doctor if there is any change in your medical condition      (cold, fever, infections).     Do not wear jewelry, make-up, hairpins, clips or nail polish. Do not wear lotions, powders, or perfumes. You may wear deodorant. Do not shave 48 hours prior to surgery. Men may shave face and neck. Do not bring valuables to the  hospital.    [facility] is not responsible for any belongings or valuables.  Contacts, dentures or bridgework may not be worn into surgery. Leave your suitcase in the car. After surgery it may be brought to your room. For patients admitted to the hospital, discharge time is determined by your treatment team.   Patients discharged the day of surgery will not be allowed to drive home.   Please read over the following fact sheets that you were given:   Surgical Site Infection Prevention / SPIROMETRTY         _X___ Take these medicines the morning of surgery with A SIP OF WATER:    1. THYROID  2. TRAMADOL  3.  4.  5.  6.  ____ Fleet Enema (as directed)   __X__ Use CHG Soap as directed  __X__ Use inhalers on the day of surgery AND BRING  ____ Stop metformin 2 days prior to surgery    ____ Take 1/2 of usual insulin dose the night before surgery. No insulin the morning          of surgery.   ____ Stop Coumadin/Plavix/aspirin on   _X___ Stop Anti-inflammatories on STOP MOBIC TODAY   ____ Stop supplements until after surgery.    ____ Bring C-Pap to the hospital.

## 2019-02-06 NOTE — Pre-Procedure Instructions (Signed)
REQUEST FOR CLEARANCE/EKG CALLED AND FAXED TO DR MORAYATI. ALSO CALLED AND FAXED INFO TO DR CHERRY. LM FOR PATIENT RE EKG TO PCP

## 2019-02-07 ENCOUNTER — Telehealth: Payer: Self-pay

## 2019-02-07 ENCOUNTER — Other Ambulatory Visit: Payer: Self-pay | Admitting: Obstetrics and Gynecology

## 2019-02-07 LAB — HIV ANTIBODY (ROUTINE TESTING W REFLEX): HIV Screen 4th Generation wRfx: NONREACTIVE

## 2019-02-07 LAB — RPR: RPR Ser Ql: REACTIVE — AB

## 2019-02-07 LAB — RPR, QUANT+TP ABS (REFLEX)
Rapid Plasma Reagin, Quant: 1:1 {titer} — ABNORMAL HIGH
T Pallidum Abs: NONREACTIVE

## 2019-02-07 LAB — SARS CORONAVIRUS 2 (TAT 6-24 HRS): SARS Coronavirus 2: NEGATIVE

## 2019-02-07 MED ORDER — HYDROCODONE-ACETAMINOPHEN 5-325 MG PO TABS: 1.0000 | ORAL_TABLET | Freq: Four times a day (QID) | ORAL | 0 refills | Status: DC | PRN

## 2019-02-07 NOTE — Pre-Procedure Instructions (Signed)
RPR results sent to Dr. Marcelline Mates for review.

## 2019-02-07 NOTE — Telephone Encounter (Signed)
Pt called to see if she had heard from her PCP concerning clearance for surgery. Pt was informed that she would have be cleared by her PCP before she could have her surgery. Pt was upset stating that she was in pain and needed to have surgery. Pt also stated that she feels like she should have went for pre-admit weeks ago. Pt was informed that due to surgery you have to be cleared in a range of days to make sure that there are not issues that would effect the surgery. Pt was still upset. Pt requested a refill of pain medication.

## 2019-02-07 NOTE — Pre-Procedure Instructions (Signed)
Per staff at Dr De [place] office patient is scheduled for clearance testing Monday 8/24 and Thursday 8/27. Dr Cherry's office notified clearance not likely for Monday 8/24.

## 2019-02-10 ENCOUNTER — Ambulatory Visit
Admission: RE | Admit: 2019-02-10 | Payer: BLUE CROSS/BLUE SHIELD | Source: Home / Self Care | Admitting: Obstetrics and Gynecology

## 2019-02-13 ENCOUNTER — Other Ambulatory Visit
Admission: RE | Admit: 2019-02-13 | Discharge: 2019-02-13 | Disposition: A | Discharge status: Home / Self Care | Payer: BLUE CROSS/BLUE SHIELD | Source: Ambulatory Visit | Attending: Obstetrics and Gynecology | Admitting: Obstetrics and Gynecology

## 2019-02-13 ENCOUNTER — Other Ambulatory Visit: Payer: Self-pay

## 2019-02-13 DIAGNOSIS — Z01812 Encounter for preprocedural laboratory examination: Secondary | ICD-10-CM | POA: Insufficient documentation

## 2019-02-13 DIAGNOSIS — Z20828 Contact with and (suspected) exposure to other viral communicable diseases: Secondary | ICD-10-CM | POA: Diagnosis not present

## 2019-02-13 LAB — SARS CORONAVIRUS 2 (TAT 6-24 HRS): SARS Coronavirus 2: NEGATIVE

## 2019-02-15 ENCOUNTER — Encounter: Payer: Self-pay | Admitting: Anesthesiology

## 2019-02-17 ENCOUNTER — Ambulatory Visit: Payer: BLUE CROSS/BLUE SHIELD | Admitting: Anesthesiology

## 2019-02-17 ENCOUNTER — Encounter
Admission: AD | Disposition: A | Discharge status: Home / Self Care | Payer: Self-pay | Source: Home / Self Care | Attending: Obstetrics and Gynecology

## 2019-02-17 ENCOUNTER — Inpatient Hospital Stay
Admission: AD | Admit: 2019-02-17 | Discharge: 2019-02-19 | DRG: 983 | Disposition: A | Discharge status: Home / Self Care | Payer: BLUE CROSS/BLUE SHIELD | Attending: Obstetrics and Gynecology | Admitting: Obstetrics and Gynecology

## 2019-02-17 ENCOUNTER — Other Ambulatory Visit: Payer: Self-pay

## 2019-02-17 ENCOUNTER — Encounter: Admission: RE | Payer: Self-pay | Source: Home / Self Care

## 2019-02-17 DIAGNOSIS — G4733 Obstructive sleep apnea (adult) (pediatric): Secondary | ICD-10-CM | POA: Diagnosis present

## 2019-02-17 DIAGNOSIS — Z6839 Body mass index (BMI) 39.0-39.9, adult: Secondary | ICD-10-CM

## 2019-02-17 DIAGNOSIS — Z780 Asymptomatic menopausal state: Secondary | ICD-10-CM | POA: Diagnosis not present

## 2019-02-17 DIAGNOSIS — N393 Stress incontinence (female) (male): Secondary | ICD-10-CM | POA: Diagnosis present

## 2019-02-17 DIAGNOSIS — N802 Endometriosis of fallopian tube: Secondary | ICD-10-CM | POA: Diagnosis present

## 2019-02-17 DIAGNOSIS — N950 Postmenopausal bleeding: Secondary | ICD-10-CM | POA: Diagnosis present

## 2019-02-17 DIAGNOSIS — N840 Polyp of corpus uteri: Secondary | ICD-10-CM | POA: Diagnosis present

## 2019-02-17 DIAGNOSIS — Z90710 Acquired absence of both cervix and uterus: Secondary | ICD-10-CM | POA: Diagnosis present

## 2019-02-17 DIAGNOSIS — R102 Pelvic and perineal pain: Secondary | ICD-10-CM | POA: Diagnosis present

## 2019-02-17 DIAGNOSIS — E039 Hypothyroidism, unspecified: Secondary | ICD-10-CM

## 2019-02-17 DIAGNOSIS — N888 Other specified noninflammatory disorders of cervix uteri: Secondary | ICD-10-CM

## 2019-02-17 HISTORY — PX: CYSTOSCOPY: SHX5120

## 2019-02-17 HISTORY — PX: PUBOVAGINAL SLING: SHX1035

## 2019-02-17 HISTORY — PX: LAPAROSCOPIC VAGINAL HYSTERECTOMY WITH SALPINGO OOPHORECTOMY: SHX6681

## 2019-02-17 HISTORY — PX: CYSTOCELE REPAIR: SHX163

## 2019-02-17 LAB — GLUCOSE, CAPILLARY
Glucose-Capillary: 91 mg/dL (ref 70–99)
Glucose-Capillary: 122 mg/dL — ABNORMAL HIGH (ref 70–99)

## 2019-02-17 LAB — URINALYSIS, ROUTINE W REFLEX MICROSCOPIC
Bacteria, UA: NONE SEEN
Bilirubin Urine: NEGATIVE
Glucose, UA: NEGATIVE mg/dL
Ketones, ur: NEGATIVE mg/dL
Leukocytes,Ua: NEGATIVE
Nitrite: NEGATIVE
Protein, ur: NEGATIVE mg/dL
RBC / HPF: >50 RBC/hpf — ABNORMAL HIGH (ref 0–5)
Specific Gravity, Urine: 1.026 (ref 1.005–1.030)
Squamous Epithelial / HPF: NONE SEEN (ref 0–5)
pH: 5 (ref 5.0–8.0)

## 2019-02-17 LAB — ABO/RH: ABO/RH(D): O POS

## 2019-02-17 SURGERY — HYSTERECTOMY, VAGINAL, LAPAROSCOPY-ASSISTED, WITH SALPINGO-OOPHORECTOMY
Anesthesia: General

## 2019-02-17 MED ORDER — DEXAMETHASONE SODIUM PHOSPHATE 10 MG/ML IJ SOLN
INTRAMUSCULAR | Status: AC
  Filled 2019-02-17: qty 1

## 2019-02-17 MED ORDER — EPHEDRINE SULFATE 50 MG/ML IJ SOLN
INTRAMUSCULAR | Status: AC
  Filled 2019-02-17: qty 1

## 2019-02-17 MED ORDER — LIDOCAINE HCL (PF) 2 % IJ SOLN
INTRAMUSCULAR | Status: AC
  Filled 2019-02-17: qty 10

## 2019-02-17 MED ORDER — OXYCODONE-ACETAMINOPHEN 5-325 MG PO TABS
1.0000 | ORAL_TABLET | ORAL | Status: DC | PRN
  Administered 2019-02-17 (×2): 2 via ORAL
  Filled 2019-02-17 (×2): qty 2

## 2019-02-17 MED ORDER — ROPINIROLE HCL 1 MG PO TABS
2.0000 mg | ORAL_TABLET | Freq: Every day | ORAL | Status: DC
  Filled 2019-02-17: qty 2

## 2019-02-17 MED ORDER — SENNOSIDES-DOCUSATE SODIUM 8.6-50 MG PO TABS: 1.0000 | ORAL_TABLET | Freq: Every evening | ORAL | Status: DC | PRN

## 2019-02-17 MED ORDER — ROCURONIUM BROMIDE 100 MG/10ML IV SOLN
INTRAVENOUS | Status: DC | PRN
  Administered 2019-02-17 (×2): 10 mg via INTRAVENOUS
  Administered 2019-02-17: 50 mg via INTRAVENOUS
  Administered 2019-02-17: 20 mg via INTRAVENOUS

## 2019-02-17 MED ORDER — FENTANYL CITRATE (PF) 250 MCG/5ML IJ SOLN
INTRAMUSCULAR | Status: AC
  Filled 2019-02-17: qty 5

## 2019-02-17 MED ORDER — PHENYLEPHRINE HCL (PRESSORS) 10 MG/ML IV SOLN
INTRAVENOUS | Status: AC
  Filled 2019-02-17: qty 1

## 2019-02-17 MED ORDER — BUPIVACAINE HCL (PF) 0.5 % IJ SOLN
INTRAMUSCULAR | Status: DC | PRN
  Administered 2019-02-17: 10 mL

## 2019-02-17 MED ORDER — LIDOCAINE HCL (CARDIAC) PF 100 MG/5ML IV SOSY
PREFILLED_SYRINGE | INTRAVENOUS | Status: DC | PRN
  Administered 2019-02-17: 100 mg via INTRAVENOUS

## 2019-02-17 MED ORDER — FENTANYL CITRATE (PF) 100 MCG/2ML IJ SOLN
INTRAMUSCULAR | Status: AC
  Filled 2019-02-17: qty 2

## 2019-02-17 MED ORDER — LACTATED RINGERS IV SOLN
INTRAVENOUS | Status: DC
  Administered 2019-02-17: 13:00:00 via INTRAVENOUS

## 2019-02-17 MED ORDER — MAGNESIUM CITRATE PO SOLN: 1.0000 | Freq: Once | ORAL | Status: DC | PRN

## 2019-02-17 MED ORDER — FENTANYL CITRATE (PF) 100 MCG/2ML IJ SOLN
INTRAMUSCULAR | Status: AC
  Administered 2019-02-17: 25 ug via INTRAVENOUS
  Filled 2019-02-17: qty 2

## 2019-02-17 MED ORDER — SODIUM CHLORIDE (PF) 0.9 % IJ SOLN
INTRAMUSCULAR | Status: AC
  Filled 2019-02-17: qty 50

## 2019-02-17 MED ORDER — SUGAMMADEX SODIUM 200 MG/2ML IV SOLN
INTRAVENOUS | Status: DC | PRN
  Administered 2019-02-17: 400 mg via INTRAVENOUS

## 2019-02-17 MED ORDER — PHENYLEPHRINE HCL (PRESSORS) 10 MG/ML IV SOLN
INTRAVENOUS | Status: DC | PRN
  Administered 2019-02-17: 50 ug via INTRAVENOUS

## 2019-02-17 MED ORDER — SUCCINYLCHOLINE CHLORIDE 20 MG/ML IJ SOLN
INTRAMUSCULAR | Status: AC
  Filled 2019-02-17: qty 1

## 2019-02-17 MED ORDER — PROPOFOL 10 MG/ML IV BOLUS
INTRAVENOUS | Status: DC | PRN
  Administered 2019-02-17: 200 mg via INTRAVENOUS
  Administered 2019-02-17: 30 mg via INTRAVENOUS

## 2019-02-17 MED ORDER — EPHEDRINE SULFATE 50 MG/ML IJ SOLN
INTRAMUSCULAR | Status: DC | PRN
  Administered 2019-02-17 (×2): 5 mg via INTRAVENOUS

## 2019-02-17 MED ORDER — SUGAMMADEX SODIUM 200 MG/2ML IV SOLN
INTRAVENOUS | Status: AC
  Filled 2019-02-17: qty 4

## 2019-02-17 MED ORDER — SERTRALINE HCL 100 MG PO TABS
100.0000 mg | ORAL_TABLET | Freq: Every day | ORAL | Status: DC
  Administered 2019-02-17 – 2019-02-18 (×2): 100 mg via ORAL
  Filled 2019-02-17 (×2): qty 1

## 2019-02-17 MED ORDER — FENTANYL CITRATE (PF) 100 MCG/2ML IJ SOLN
100.0000 ug | INTRAMUSCULAR | Status: DC | PRN
  Administered 2019-02-17: 100 ug via INTRAVENOUS
  Filled 2019-02-17: qty 2

## 2019-02-17 MED ORDER — ALPRAZOLAM 0.5 MG PO TABS
0.5000 mg | ORAL_TABLET | Freq: Every day | ORAL | Status: DC | PRN
  Administered 2019-02-17 – 2019-02-18 (×2): 0.5 mg via ORAL
  Filled 2019-02-17 (×2): qty 1

## 2019-02-17 MED ORDER — ALUM & MAG HYDROXIDE-SIMETH 200-200-20 MG/5ML PO SUSP: 30.0000 mL | ORAL | Status: DC | PRN

## 2019-02-17 MED ORDER — LIDOCAINE-EPINEPHRINE 1 %-1:100000 IJ SOLN
INTRAMUSCULAR | Status: AC
  Filled 2019-02-17: qty 1

## 2019-02-17 MED ORDER — ZOLPIDEM TARTRATE 5 MG PO TABS: 5.0000 mg | ORAL_TABLET | Freq: Every evening | ORAL | Status: DC | PRN

## 2019-02-17 MED ORDER — MENTHOL 3 MG MT LOZG
1.0000 | LOZENGE | OROMUCOSAL | Status: DC | PRN
  Filled 2019-02-17: qty 9

## 2019-02-17 MED ORDER — ACETAMINOPHEN 500 MG PO TABS
1000.0000 mg | ORAL_TABLET | ORAL | Status: AC
  Administered 2019-02-17: 07:00:00 1000 mg via ORAL

## 2019-02-17 MED ORDER — BISACODYL 10 MG RE SUPP: 10.0000 mg | Freq: Every day | RECTAL | Status: DC | PRN

## 2019-02-17 MED ORDER — DOCUSATE SODIUM 100 MG PO CAPS
100.0000 mg | ORAL_CAPSULE | Freq: Two times a day (BID) | ORAL | Status: DC
  Administered 2019-02-17 – 2019-02-18 (×3): 100 mg via ORAL
  Filled 2019-02-17 (×5): qty 1

## 2019-02-17 MED ORDER — PROPOFOL 10 MG/ML IV BOLUS
INTRAVENOUS | Status: AC
  Filled 2019-02-17: qty 40

## 2019-02-17 MED ORDER — KETAMINE HCL 50 MG/ML IJ SOLN
INTRAMUSCULAR | Status: AC
  Filled 2019-02-17: qty 10

## 2019-02-17 MED ORDER — ONDANSETRON HCL 4 MG/2ML IJ SOLN: 4.0000 mg | Freq: Once | INTRAMUSCULAR | Status: DC | PRN

## 2019-02-17 MED ORDER — BELLADONNA ALKALOIDS-OPIUM 16.2-60 MG RE SUPP
1.0000 | Freq: Once | RECTAL | Status: AC
  Administered 2019-02-17: 12:00:00 1 via RECTAL

## 2019-02-17 MED ORDER — FENTANYL CITRATE (PF) 100 MCG/2ML IJ SOLN
25.0000 ug | INTRAMUSCULAR | Status: DC | PRN
  Administered 2019-02-17 (×4): 25 ug via INTRAVENOUS

## 2019-02-17 MED ORDER — MIDAZOLAM HCL 2 MG/2ML IJ SOLN
INTRAMUSCULAR | Status: AC
  Filled 2019-02-17: qty 2

## 2019-02-17 MED ORDER — KETAMINE HCL 50 MG/ML IJ SOLN
INTRAMUSCULAR | Status: DC | PRN
  Administered 2019-02-17: 30 mg via INTRAMUSCULAR

## 2019-02-17 MED ORDER — LACTATED RINGERS IV SOLN: INTRAVENOUS | Status: DC

## 2019-02-17 MED ORDER — KETOROLAC TROMETHAMINE 30 MG/ML IJ SOLN
30.0000 mg | Freq: Four times a day (QID) | INTRAMUSCULAR | Status: AC
  Administered 2019-02-17 – 2019-02-18 (×4): 30 mg via INTRAVENOUS
  Filled 2019-02-17 (×4): qty 1

## 2019-02-17 MED ORDER — GABAPENTIN 300 MG PO CAPS: 300.0000 mg | ORAL_CAPSULE | ORAL | Status: DC

## 2019-02-17 MED ORDER — MIDAZOLAM HCL 2 MG/2ML IJ SOLN
INTRAMUSCULAR | Status: DC | PRN
  Administered 2019-02-17: 2 mg via INTRAVENOUS

## 2019-02-17 MED ORDER — ONDANSETRON HCL 4 MG/2ML IJ SOLN
4.0000 mg | Freq: Four times a day (QID) | INTRAMUSCULAR | Status: DC | PRN
  Administered 2019-02-17 – 2019-02-18 (×3): 4 mg via INTRAVENOUS
  Filled 2019-02-17 (×3): qty 2

## 2019-02-17 MED ORDER — FAMOTIDINE 20 MG PO TABS
ORAL_TABLET | ORAL | Status: AC
  Administered 2019-02-17: 07:00:00 20 mg via ORAL
  Filled 2019-02-17: qty 1

## 2019-02-17 MED ORDER — DEXAMETHASONE SODIUM PHOSPHATE 10 MG/ML IJ SOLN
INTRAMUSCULAR | Status: DC | PRN
  Administered 2019-02-17: 10 mg via INTRAVENOUS

## 2019-02-17 MED ORDER — SIMETHICONE 80 MG PO CHEW
80.0000 mg | CHEWABLE_TABLET | Freq: Four times a day (QID) | ORAL | Status: DC | PRN
  Administered 2019-02-17: 80 mg via ORAL
  Filled 2019-02-17: qty 1

## 2019-02-17 MED ORDER — ONDANSETRON HCL 4 MG/2ML IJ SOLN
INTRAMUSCULAR | Status: AC
  Filled 2019-02-17: qty 2

## 2019-02-17 MED ORDER — ONDANSETRON HCL 4 MG/2ML IJ SOLN
INTRAMUSCULAR | Status: DC | PRN
  Administered 2019-02-17: 4 mg via INTRAVENOUS

## 2019-02-17 MED ORDER — FAMOTIDINE 20 MG PO TABS
20.0000 mg | ORAL_TABLET | Freq: Once | ORAL | Status: AC
  Administered 2019-02-17: 07:00:00 20 mg via ORAL

## 2019-02-17 MED ORDER — SODIUM CHLORIDE 0.9 % IV SOLN
INTRAVENOUS | Status: DC
  Administered 2019-02-17 (×2): via INTRAVENOUS

## 2019-02-17 MED ORDER — IBUPROFEN 800 MG PO TABS
800.0000 mg | ORAL_TABLET | Freq: Four times a day (QID) | ORAL | Status: DC
  Administered 2019-02-18 – 2019-02-19 (×4): 800 mg via ORAL
  Filled 2019-02-17 (×4): qty 1

## 2019-02-17 MED ORDER — KETOROLAC TROMETHAMINE 30 MG/ML IJ SOLN
INTRAMUSCULAR | Status: DC | PRN
  Administered 2019-02-17: 30 mg via INTRAVENOUS

## 2019-02-17 MED ORDER — LIDOCAINE 5 % EX PTCH
1.0000 | MEDICATED_PATCH | CUTANEOUS | Status: DC
  Administered 2019-02-17 – 2019-02-18 (×2): 1 via TRANSDERMAL
  Filled 2019-02-17 (×2): qty 1

## 2019-02-17 MED ORDER — KETOROLAC TROMETHAMINE 30 MG/ML IJ SOLN
INTRAMUSCULAR | Status: AC
  Filled 2019-02-17: qty 1

## 2019-02-17 MED ORDER — ACETAMINOPHEN 500 MG PO TABS
ORAL_TABLET | ORAL | Status: AC
  Administered 2019-02-17: 07:00:00 1000 mg via ORAL
  Filled 2019-02-17: qty 2

## 2019-02-17 MED ORDER — FENTANYL CITRATE (PF) 100 MCG/2ML IJ SOLN
INTRAMUSCULAR | Status: DC | PRN
  Administered 2019-02-17 (×2): 50 ug via INTRAVENOUS
  Administered 2019-02-17: 100 ug via INTRAVENOUS
  Administered 2019-02-17 (×5): 50 ug via INTRAVENOUS

## 2019-02-17 MED ORDER — BELLADONNA ALKALOIDS-OPIUM 16.2-60 MG RE SUPP
RECTAL | Status: AC
  Administered 2019-02-17: 12:00:00 1 via RECTAL
  Filled 2019-02-17: qty 1

## 2019-02-17 MED ORDER — PANTOPRAZOLE SODIUM 40 MG PO TBEC
40.0000 mg | DELAYED_RELEASE_TABLET | Freq: Every day | ORAL | Status: DC
  Administered 2019-02-17 – 2019-02-19 (×3): 40 mg via ORAL
  Filled 2019-02-17 (×3): qty 1

## 2019-02-17 MED ORDER — DEXTROSE 5 % IV SOLN
3.0000 g | INTRAVENOUS | Status: AC
  Administered 2019-02-17: 08:00:00 3 g via INTRAVENOUS
  Filled 2019-02-17: qty 3000

## 2019-02-17 MED ORDER — ROPINIROLE HCL 1 MG PO TABS
2.0000 mg | ORAL_TABLET | Freq: Every day | ORAL | Status: DC
  Administered 2019-02-17 – 2019-02-18 (×2): 2 mg via ORAL
  Filled 2019-02-17 (×3): qty 2

## 2019-02-17 MED ORDER — PHENAZOPYRIDINE HCL 200 MG PO TABS
200.0000 mg | ORAL_TABLET | Freq: Three times a day (TID) | ORAL | Status: DC
  Administered 2019-02-17 – 2019-02-18 (×4): 200 mg via ORAL
  Filled 2019-02-17 (×6): qty 1

## 2019-02-17 MED ORDER — SERTRALINE HCL 100 MG PO TABS: 100.0000 mg | ORAL_TABLET | Freq: Every day | ORAL | Status: DC

## 2019-02-17 MED ORDER — THYROID 60 MG PO TABS: 180.0000 mg | ORAL_TABLET | Freq: Every day | ORAL | Status: DC

## 2019-02-17 MED ORDER — VASOPRESSIN 20 UNIT/ML IV SOLN
INTRAVENOUS | Status: AC
  Filled 2019-02-17: qty 1

## 2019-02-17 MED ORDER — ROCURONIUM BROMIDE 50 MG/5ML IV SOLN
INTRAVENOUS | Status: AC
  Filled 2019-02-17: qty 2

## 2019-02-17 MED ORDER — THYROID 60 MG PO TABS
180.0000 mg | ORAL_TABLET | Freq: Every day | ORAL | Status: DC
  Administered 2019-02-17 – 2019-02-18 (×2): 180 mg via ORAL
  Filled 2019-02-17 (×3): qty 3

## 2019-02-17 MED ORDER — VASOPRESSIN 20 UNIT/ML IV SOLN
INTRAVENOUS | Status: DC | PRN
  Administered 2019-02-17: 19 mL via INTRAMUSCULAR

## 2019-02-17 MED ORDER — ONDANSETRON HCL 4 MG PO TABS
4.0000 mg | ORAL_TABLET | Freq: Four times a day (QID) | ORAL | Status: DC | PRN
  Administered 2019-02-18: 4 mg via ORAL
  Filled 2019-02-17: qty 1

## 2019-02-17 SURGICAL SUPPLY — 82 items
BAG URINE DRAINAGE (UROLOGICAL SUPPLIES) ×4 IMPLANT
BLADE SURG 15 STRL LF DISP TIS (BLADE) ×2 IMPLANT
BLADE SURG 15 STRL SS (BLADE) ×2
BLADE SURG SZ10 CARB STEEL (BLADE) ×4 IMPLANT
BLADE SURG SZ11 CARB STEEL (BLADE) ×4 IMPLANT
BNDG GAUZE 4.5X4.1 6PLY STRL (MISCELLANEOUS) ×4 IMPLANT
CANISTER SUCT 1200ML W/VALVE (MISCELLANEOUS) ×4 IMPLANT
CATH FOLEY 2WAY  5CC 16FR (CATHETERS) ×2
CATH ROBINSON RED A/P 16FR (CATHETERS) ×4 IMPLANT
CATH URTH 16FR FL 2W BLN LF (CATHETERS) ×2 IMPLANT
CHLORAPREP W/TINT 26 (MISCELLANEOUS) ×4 IMPLANT
CLOSURE WOUND 1/2 X4 (GAUZE/BANDAGES/DRESSINGS) ×1
COVER WAND RF STERILE (DRAPES) ×4 IMPLANT
DERMABOND ADVANCED (GAUZE/BANDAGES/DRESSINGS) ×2
DERMABOND ADVANCED .7 DNX12 (GAUZE/BANDAGES/DRESSINGS) ×2 IMPLANT
DRAPE LAPAROTOMY 100X77 ABD (DRAPES) ×4 IMPLANT
DRAPE PERI LITHO V/GYN (MISCELLANEOUS) ×4 IMPLANT
DRAPE UNDER BUTTOCK W/FLU (DRAPES) ×4 IMPLANT
ELECT CAUTERY BLADE 6.4 (BLADE) ×4 IMPLANT
ELECT REM PT RETURN 9FT ADLT (ELECTROSURGICAL) ×4
ELECTRODE REM PT RTRN 9FT ADLT (ELECTROSURGICAL) ×2 IMPLANT
GAUZE 4X4 16PLY RFD (DISPOSABLE) ×4 IMPLANT
GAUZE PACK 2X3YD (GAUZE/BANDAGES/DRESSINGS) ×4 IMPLANT
GLOVE BIO SURGEON STRL SZ 6.5 (GLOVE) ×3 IMPLANT
GLOVE BIO SURGEON STRL SZ8 (GLOVE) ×8 IMPLANT
GLOVE BIO SURGEONS STRL SZ 6.5 (GLOVE) ×1
GLOVE BIOGEL PI ORTHO PRO 7.5 (GLOVE) ×2
GLOVE INDICATOR 7.0 STRL GRN (GLOVE) ×8 IMPLANT
GLOVE PI ORTHO PRO STRL 7.5 (GLOVE) ×2 IMPLANT
GOWN STRL REUS W/ TWL LRG LVL3 (GOWN DISPOSABLE) ×4 IMPLANT
GOWN STRL REUS W/ TWL XL LVL3 (GOWN DISPOSABLE) ×2 IMPLANT
GOWN STRL REUS W/TWL LRG LVL3 (GOWN DISPOSABLE) ×4
GOWN STRL REUS W/TWL XL LVL3 (GOWN DISPOSABLE) ×2
HANDLE YANKAUER SUCT BULB TIP (MISCELLANEOUS) ×4 IMPLANT
IRRIGATION STRYKERFLOW (MISCELLANEOUS) ×2 IMPLANT
IRRIGATOR STRYKERFLOW (MISCELLANEOUS) ×4
IV LACTATED RINGERS 1000ML (IV SOLUTION) ×4 IMPLANT
KIT PINK PAD W/HEAD ARE REST (MISCELLANEOUS) ×4
KIT PINK PAD W/HEAD ARM REST (MISCELLANEOUS) ×2 IMPLANT
KIT TURNOVER CYSTO (KITS) ×4 IMPLANT
LABEL OR SOLS (LABEL) ×4 IMPLANT
LIGASURE VESSEL 5MM BLUNT TIP (ELECTROSURGICAL) IMPLANT
MANIPULATOR VCARE LG CRV RETR (MISCELLANEOUS) IMPLANT
MANIPULATOR VCARE SML CRV RETR (MISCELLANEOUS) IMPLANT
MANIPULATOR VCARE STD CRV RETR (MISCELLANEOUS) IMPLANT
NDL SAFETY ECLIPSE 18X1.5 (NEEDLE) ×2 IMPLANT
NDL SPNL 22GX3.5 QUINCKE BK (NEEDLE) ×2 IMPLANT
NEEDLE HYPO 18GX1.5 SHARP (NEEDLE) ×2
NEEDLE HYPO 22GX1.5 SAFETY (NEEDLE) ×4 IMPLANT
NEEDLE SPNL 22GX3.5 QUINCKE BK (NEEDLE) ×4 IMPLANT
NS IRRIG 500ML POUR BTL (IV SOLUTION) ×4 IMPLANT
OCCLUDER COLPOPNEUMO (BALLOONS) IMPLANT
PACK BASIN MINOR ARMC (MISCELLANEOUS) ×4 IMPLANT
PACK GYN LAPAROSCOPIC (MISCELLANEOUS) ×4 IMPLANT
PAD OB MATERNITY 4.3X12.25 (PERSONAL CARE ITEMS) ×4 IMPLANT
PAD PREP 24X41 OB/GYN DISP (PERSONAL CARE ITEMS) ×4 IMPLANT
SCISSORS METZENBAUM CVD 33 (INSTRUMENTS) ×4 IMPLANT
SET CYSTO W/LG BORE CLAMP LF (SET/KITS/TRAYS/PACK) ×4 IMPLANT
SHEARS HARMONIC ACE PLUS 36CM (ENDOMECHANICALS) ×2 IMPLANT
SLEEVE ENDOPATH XCEL 5M (ENDOMECHANICALS) ×8 IMPLANT
SLING TRANSOBTURATOR OBTRYX (Sling) ×2 IMPLANT
STRAP SAFETY 5IN WIDE (MISCELLANEOUS) ×4 IMPLANT
STRIP CLOSURE SKIN 1/2X4 (GAUZE/BANDAGES/DRESSINGS) ×3 IMPLANT
SURGILUBE 2OZ TUBE FLIPTOP (MISCELLANEOUS) ×4 IMPLANT
SUT MNCRL 4-0 (SUTURE) ×2
SUT MNCRL 4-0 27XMFL (SUTURE) ×2
SUT VIC AB 0 CT1 27 (SUTURE) ×2
SUT VIC AB 0 CT1 27XCR 8 STRN (SUTURE) ×2 IMPLANT
SUT VIC AB 0 CT1 36 (SUTURE) ×4 IMPLANT
SUT VIC AB 0 CT2 27 (SUTURE) ×4 IMPLANT
SUT VIC AB 2-0 CT1 (SUTURE) ×4 IMPLANT
SUT VIC AB 2-0 UR6 27 (SUTURE) ×2 IMPLANT
SUT VIC AB 4-0 FS2 27 (SUTURE) ×4 IMPLANT
SUT VICRYL+ 3-0 36IN CT-1 (SUTURE) ×4 IMPLANT
SUTURE MNCRL 4-0 27XMF (SUTURE) ×2 IMPLANT
SYR 10ML LL (SYRINGE) ×4 IMPLANT
SYR 50ML LL SCALE MARK (SYRINGE) IMPLANT
SYR CONTROL 10ML LL (SYRINGE) ×4 IMPLANT
SYRINGE IRR TOOMEY STRL 70CC (SYRINGE) ×4 IMPLANT
TAPE TRANSPORE STRL 2 31045 (GAUZE/BANDAGES/DRESSINGS) ×4 IMPLANT
TROCAR XCEL NON-BLD 5MMX100MML (ENDOMECHANICALS) ×4 IMPLANT
TUBING EVAC SMOKE HEATED PNEUM (TUBING) ×4 IMPLANT

## 2019-02-17 NOTE — Anesthesia Procedure Notes (Signed)
Procedure Name: Intubation Date/Time: 02/17/2019 7:42 AM Performed by: Lavone Orn, CRNA Pre-anesthesia Checklist: Patient identified, Emergency Drugs available, Suction available, Patient being monitored and Timeout performed Patient Re-evaluated:Patient Re-evaluated prior to induction Oxygen Delivery Method: Circle system utilized Preoxygenation: Pre-oxygenation with 100% oxygen Induction Type: IV induction Ventilation: Mask ventilation without difficulty Laryngoscope Size: Glidescope and 4 Grade View: Grade I Tube type: Oral Tube size: 7.0 mm Number of attempts: 1 Airway Equipment and Method: Stylet and Video-laryngoscopy Placement Confirmation: ETT inserted through vocal cords under direct vision,  positive ETCO2 and breath sounds checked- equal and bilateral Secured at: 22 cm Tube secured with: Tape Dental Injury: Teeth and Oropharynx as per pre-operative assessment

## 2019-02-17 NOTE — Interval H&P Note (Signed)
History and Physical Interval Note:  02/17/2019 7:28 AM  Marie Booker  has presented today for surgery, with the diagnosis of PELVIC PAIN,H/O ENDOMETRIOSIS, ENDOMETRIAL POLYP, PMB, STRESS INCONTINENCE.  The various methods of treatment have been discussed with the patient and family. After consideration of risks, benefits and other options for treatment, the patient has consented to  Procedure(s): LAPAROSCOPIC ASSISTED VAGINAL HYSTERECTOMY WITH BILATERAL SALPINGO OOPHORECTOMY (Bilateral) ANTERIOR REPAIR (CYSTOCELE) (N/A), CYSTOSCOPY (N/A), PUBO-VAGINAL SLING (N/A) as a surgical intervention, with assistance from Dr. Jeannie Fend, MD.  The patient's history has been reviewed, patient examined, no change in status, stable for surgery.  I have reviewed the patient's chart and labs.  Questions were answered to the patient's satisfaction.     [name], MD [facility]

## 2019-02-17 NOTE — Anesthesia Post-op Follow-up Note (Signed)
Anesthesia QCDR form completed.        

## 2019-02-17 NOTE — Progress Notes (Signed)
Pt in PACU. Abdominal pain relieved with fentanyl. Pt now c/o burning with Foley catheter, stating she has "never done well with Foleys". She is asking to remove it. Explained I am unable to remove with order for removal POD1. Ice pack to site. Paged Dr. Marcelline Mates. Awaiting call back.

## 2019-02-17 NOTE — Progress Notes (Signed)
Amy, PACU RN, spoke with Dr. Marcelline Mates regarding patient concerns regarding foley burning. Per Dr. Marcelline Mates, ok for B&O suppository. Foley will need to stay in place until POD1

## 2019-02-17 NOTE — Anesthesia Preprocedure Evaluation (Signed)
Anesthesia Evaluation  Patient identified by MRN, date of birth, ID band Patient awake    Reviewed: Allergy & Precautions, NPO status , Patient's Chart, lab work & pertinent test results  History of Anesthesia Complications (+) Family history of anesthesia reactionNegative for: history of anesthetic complications  Airway Mallampati: III       Dental   Pulmonary asthma (no inhalers x 4 months) , sleep apnea (not using CPAP) , Not current smoker,           Cardiovascular (-) hypertension(-) Past MI and (-) CHF + dysrhythmias (-) Valvular Problems/Murmurs     Neuro/Psych neg Seizures Anxiety Depression    GI/Hepatic Neg liver ROS, neg GERD  ,  Endo/Other  diabetes (borderline, no meds)Hypothyroidism   Renal/GU negative Renal ROS     Musculoskeletal   Abdominal   Peds  Hematology  (+) anemia ,   Anesthesia Other Findings   Reproductive/Obstetrics                             Anesthesia Physical Anesthesia Plan  ASA: III  Anesthesia Plan: General   Post-op Pain Management:    Induction: Intravenous  PONV Risk Score and Plan: 3 and Dexamethasone, Ondansetron and Midazolam  Airway Management Planned: Oral ETT  Additional Equipment:   Intra-op Plan:   Post-operative Plan:   Informed Consent: I have reviewed the patients History and Physical, chart, labs and discussed the procedure including the risks, benefits and alternatives for the proposed anesthesia with the patient or authorized representative who has indicated his/her understanding and acceptance.       Plan Discussed with:   Anesthesia Plan Comments:         Anesthesia Quick Evaluation

## 2019-02-17 NOTE — Anesthesia Postprocedure Evaluation (Signed)
Anesthesia Post Note  Patient: Tomasa Dobransky Lear  Procedure(s) Performed: LAPAROSCOPIC ASSISTED VAGINAL HYSTERECTOMY WITH BILATERAL SALPINGO OOPHORECTOMY (Bilateral ) ANTERIOR REPAIR (CYSTOCELE) (N/A ) CYSTOSCOPY (N/A ) PUBO-VAGINAL SLING (N/A )  Patient location during evaluation: PACU Anesthesia Type: General Level of consciousness: awake and alert Pain management: pain level controlled Vital Signs Assessment: post-procedure vital signs reviewed and stable Respiratory status: spontaneous breathing and respiratory function stable Cardiovascular status: stable Anesthetic complications: no     Last Vitals:  Vitals:   02/17/19 0630 02/17/19 1047  BP:  138/75  Pulse: 72 74  Resp: 20 11  Temp: (!) 36.3 C 36.5 C  SpO2: 99% 100%    Last Pain:  Vitals:   02/17/19 1047  TempSrc:   PainSc: Asleep                 Amiyah Shryock K

## 2019-02-17 NOTE — OR Nursing (Signed)
Patient stated she is not able to tolerate gabapentin, canceled per Dr Marcelline Mates, Upreg also canceled patient is postmenopausal.

## 2019-02-17 NOTE — Transfer of Care (Signed)
Immediate Anesthesia Transfer of Care Note  Patient: Marie Booker  Procedure(s) Performed: LAPAROSCOPIC ASSISTED VAGINAL HYSTERECTOMY WITH BILATERAL SALPINGO OOPHORECTOMY (Bilateral ) ANTERIOR REPAIR (CYSTOCELE) (N/A ) CYSTOSCOPY (N/A ) PUBO-VAGINAL SLING (N/A )  Patient Location: PACU  Anesthesia Type:General  Level of Consciousness: awake and drowsy  Airway & Oxygen Therapy: Patient Spontanous Breathing and Patient connected to face mask oxygen  Post-op Assessment: Report given to RN and Post -op Vital signs reviewed and stable  Post vital signs: stable  Last Vitals:  Vitals Value Taken Time  BP 138/75 02/17/19 1047  Temp 36.5 C 02/17/19 1047  Pulse 71 02/17/19 1053  Resp 12 02/17/19 1053  SpO2 100 % 02/17/19 1053  Vitals shown include unvalidated device data.  Last Pain:  Vitals:   02/17/19 1047  TempSrc:   PainSc: (P) Asleep         Complications: No apparent anesthesia complications

## 2019-02-17 NOTE — Op Note (Signed)
Procedure(s): LAPAROSCOPIC ASSISTED VAGINAL HYSTERECTOMY WITH BILATERAL SALPINGO OOPHORECTOMY PUBO-VAGINAL SLING Procedure Note  Marie Booker female 53 y.o. 02/17/2019  Indications: The patient is a 53 y.o. G3P3 female with moderate obesity, hypothyroidism, pelvic pain, history of endometriosis, endometrial polyp, postmenopausal bleeding, and stress incontinence.  Pre-operative Diagnosis: moderate obesity, hypothyroidism, pelvic pain, history of endometriosis, endometrial polyp, postmenopausal bleeding, and stress incontinence. Patient also with a history of sleep apnea.    Post-operative Diagnosis: Same  Surgeon: [name], MD  Assistants:  Jeannie Fend, MD. No other capable assistant available in surgery requiring a high level assistant.   Anesthesia: General endotracheal anesthesia  Findings: Small uterus, normal adnexa bilaterally. Possible endometriosis implants along left fallopian tube (2 purple bleb lesions). Adhesions of the omentum to the right abdominal sidewall and left pelvic sidewall.  No other abdominal/pelvic abnormality.  Normal upper abdomen. No cystocele noted.   Procedure Details: The patient was seen in the Holding Room. The risks, benefits, complications, treatment options, and expected outcomes were discussed with the patient.  The patient concurred with the proposed plan, giving informed consent.  The site of surgery properly noted/marked. The patient was taken to the Operating Room, identified as Marie Booker and the procedure verified as Procedure(s) (LRB): LAPAROSCOPIC ASSISTED VAGINAL HYSTERECTOMY WITH BILATERAL SALPINGO OOPHORECTOMY (Bilateral), PUBO-VAGINAL SLING (N/A).  The patient received intravenous antibiotics and had sequential compression devices applied to her lower extremities while in the preoperative area.    She was then placed under general anesthesia without difficulty. She was placed in the dorsal lithotomy position, and was prepped and draped  in a sterile manner.  After an adequate timeout was performed, a Foley catheter was inserted into her bladder, and  attached to constant drainage. The anterior lip of the cervix was grasped with a tenaculum. A Hulka clamp was then advanced into the uterus for uterine manipulation.    Attention was turned to the abdomen where an umbilical incision was made with the scalpel.  The Optiview 5-mm trocar and sleeve were then advanced without difficulty with the laparoscope under direct visualization into the abdomen.  The abdomen was then insufflated with carbon dioxide gas and adequate pneumoperitoneum was obtained.  Bilateral 5-mm lower quadrant ports were then placed under direct visualization.  A survey of the patient's pelvis and abdomen revealed the findings as above.  Attention was turned to the infundibulopelvic ligament on the patient's right side, which was clamped using the Harmonic scalpel and ligated. The broad ligament was also ligated with the Harmonic scalpel working towards the round ligament.  The round and broad ligaments were then clamped and transected with the Harmonic scalpel.  The ureter were noted to be safely away from the area of dissection.  The uterine artery was then skeletonized and a bladder flap was created.  The bladder was then bluntly dissected off the lower uterine segment.  At this point, attention was turned to the right uterine vessels, which were clamped and ligated using the Harmonic scalpel.  Attention was then turned to the patient's left side, which was treated in a similar manner by taking the infundibulopelvic ligament, the round ligament and the broad ligaments and the bladder flap creation was completed. The left uterine vessels were also clamped and transected in a similar fashion. A final survey of the abdomen was performed and good hemostasis was noted throughout. No intraoperative injury to other surrounding organs was noted.  All instruments were then removed from  the abdomen, and the abdomen was  allowed to desufflate.All skin incisions were closed with 3-0 Vicryl, injected with 0.5% Marcaine (total of 10 cc), and covered with steri-strips.   Attention was then turned to her pelvis for completion of the hysterectomy via the vaginal route.  A weighted speculum was then placed in the vagina, and the anterior and posterior lips of the cervix were grasped bilaterally with tenaculums.  The cervix was then injected circumferentially with dilute Pitressin solution to maintain hemostasis.  The cervix was then circumferentially incised, and the posterior cul-de-sac was entered sharply without difficulty.  A long weighted speculum was inserted into the posterior cul-de-sac. The Heaney clamp was then used to clamp the uterosacral ligaments on either side.  They were then cut and sutured ligated with 0 Vicryl, and were held with a tag for later identification. Of note, all sutures used in this case were 0 Vicryl unless otherwise noted.   The cardinal ligaments were then clamped, cut and ligated bilaterally.  At this point, full entry into the anterior cul-de-sac was made, no injury to the bladder was noted.  The uterus, tubes and ovaries were freed from all ligaments and was then delivered and sent to pathology.  The vaginal cuff was reperitonealized using a running suture of 0-Vicryl. The vaginal cuff was then closed in a running locked fashion with 0 Vicryl with care given to incorporate the uterosacral pedicles bilaterally.      Dermabond. The patient tolerated the procedures well.  All instruments, needles, and sponge counts were correct  times three. The patient was taken to the recovery room awake, extubated and in stable condition.   Next, the vaginal mucosa beginning at the vaginal cuff and overlying the bladder, was grasped with Allis clamps and injected with a dilute Pitressin solution in the midline. A midline incision was made to the level of the urethra with a scalpel.  The vaginal mucosa was dissected laterally from the underlying attenuated fascia. The obturator foramina were identified in the usual manner bilaterally and marked with a marking pen the skin and subcutaneous tissues were injected with a dilute Pitressin solution. Stab incisions were made and the TOT (Halo) trochars were placed through these incisions onto the operator's finger in the vagina which was retracted and bladder medially. The vaginal tape was then placed on the trochars and reversed through these incisions. A Kelly clamp was placed under the tape and the sleeves of the tape were removed. The tape was noted to be correctly positioned underneath the urethra without twists. The excess tape was removed at the level of the skin.  Steri-Strips were applied over these small skin incisions. A typical Kelly plication was performed carefully covering the tension-free vaginal tape with thickened fascia.   A shelf of fascia was then approximated in the midline placing the bladder back in its more anatomic position using a suture of 2-0 Vicryl.The excess vaginal mucosa was trimmed. Vaginal mucosa was then closed in the midline with interrupted sutures to the level of the vaginal cuff with 0-Vicryl. Hemostasis was noted.  The patient tolerated the procedure well.  All counts were correct.  The patient was taken to the recovery room in stable condition.    Estimated Blood Loss:  100 ml      Drains: foley catheter to gravity, with 200 ml of clear urine at end of the procedure.          Total IV Fluids:  1550 ml  Specimens: Uterus with cervix, bilateral fallopian tubes and ovaries  Implants: None         Complications:  None; patient tolerated the procedure well.         Disposition: PACU - hemodynamically stable.         Condition: stable   [name], MD [facility]

## 2019-02-18 LAB — CBC
HCT: 35.6 % — ABNORMAL LOW (ref 36.0–46.0)
Hemoglobin: 11.7 g/dL — ABNORMAL LOW (ref 12.0–15.0)
MCH: 29.6 pg (ref 26.0–34.0)
MCHC: 32.9 g/dL (ref 30.0–36.0)
MCV: 90.1 fL (ref 80.0–100.0)
Platelets: 214 10*3/uL (ref 150–400)
RBC: 3.95 MIL/uL (ref 3.87–5.11)
RDW: 13.2 % (ref 11.5–15.5)
WBC: 10.9 10*3/uL — ABNORMAL HIGH (ref 4.0–10.5)
nRBC: 0 % (ref 0.0–0.2)

## 2019-02-18 LAB — URINE CULTURE
Culture: NO GROWTH
Special Requests: NORMAL

## 2019-02-18 LAB — CREATININE, SERUM
Creatinine, Ser: 0.9 mg/dL (ref 0.44–1.00)
GFR calc Af Amer: >60 mL/min (ref >60)
GFR calc non Af Amer: >60 mL/min (ref >60)

## 2019-02-18 LAB — SURGICAL PATHOLOGY

## 2019-02-18 MED ORDER — TRAMADOL HCL 50 MG PO TABS
100.0000 mg | ORAL_TABLET | Freq: Four times a day (QID) | ORAL | Status: DC
  Administered 2019-02-18 – 2019-02-19 (×4): 100 mg via ORAL
  Filled 2019-02-18 (×5): qty 2

## 2019-02-18 MED ORDER — HYDROCODONE-ACETAMINOPHEN 5-325 MG PO TABS
1.0000 | ORAL_TABLET | ORAL | Status: DC | PRN
  Administered 2019-02-18: 1 via ORAL
  Filled 2019-02-18: qty 1

## 2019-02-18 MED ORDER — MAGNESIUM HYDROXIDE 400 MG/5ML PO SUSP
30.0000 mL | Freq: Every day | ORAL | Status: DC
  Administered 2019-02-18: 30 mL via ORAL
  Filled 2019-02-18: qty 30

## 2019-02-18 NOTE — Progress Notes (Signed)
Post-Operative Day # 1, s/p TLH with BSO, urethral sling placement  Subjective: no complaints, up ad lib, voiding and tolerating PO.  Does report some nausea, but notes that she thinks it is due to the pain medication (notes she has always had a difficult time tolerating Percocet).  Also notes that she did not get much sleep last night (has a history of sleep disturbances) so is tired this morning.  Concerned about her urine test (reported bladder burning and irritation yesterday with catheter in place, reported recently treated UTI ~ 2 weeks ago). Also worried about bowel movements (notes that she has a history of irregular bowel movements, goes 2-3 times per week).   Objective: Temp:  [97.3 F (36.3 C)-99.2 F (37.3 C)] 98.4 F (36.9 C) (09/01 0758) Pulse Rate:  [62-81] 77 (09/01 0758) Resp:  [11-20] 20 (09/01 0758) BP: (107-155)/(48-85) 130/59 (09/01 0758) SpO2:  [93 %-100 %] 98 % (09/01 0758)  Physical Exam:  General: alert and no distress  Lungs: clear to auscultation bilaterally Heart: regular rate and rhythm, S1, S2 normal, no murmur, click, rub or gallop Abdomen: soft, non-tender; bowel sounds normal; no masses,  no organomegaly Pelvis:Bleeding: appropriate,  Incision: healing well, no significant drainage, no dehiscence, no significant erythema Extremities: DVT Evaluation: No evidence of DVT seen on physical exam. Negative Homan's sign. No cords or calf tenderness. No significant calf/ankle edema.   CBC Latest Ref Rng & Units 02/18/2019 02/06/2019  WBC 4.0 - 10.5 K/uL 10.9(H) 7.2  Hemoglobin 12.0 - 15.0 g/dL 11.7(L) 13.4  Hematocrit 36.0 - 46.0 % 35.6(L) 41.1  Platelets 150 - 400 K/uL 214 258     Lab Results  Component Value Date   CREATININE 0.90 02/18/2019     Assessment/Plan: Continue routine post-operative care.  Maintain foley catheter. To undergo bladder training today, clamp bladder then unclamp q 2 hrs.  Will remove foley in a.m. Urinary irritation, continue use  of Pyridium while catheter in place. UA negative, awaiting culture.  Will change PO pain medication from Percocet to Norco, and restart Tramadol.  Advance diet as tolerated Encourage ambulation Encourage use of incentive spirometry Will treat bowels with milk of magnesia.  Dispo: likely d/c home tomorrow if bladder training goes well.     Marie Maid, MD [facility]

## 2019-02-18 NOTE — Progress Notes (Signed)
Spoke with Dr. Marcelline Mates about how bladder training has been today. Pt up to bathroom this afternoon around 1620 for BM and started to pee "a large amount" into toilet. Verbal order to remove foley at this time. See new orders.

## 2019-02-18 NOTE — Progress Notes (Signed)
Pt worried about low BP at 2335 of 107/48. She stated it normally runs in the 130s/70s.  Rechecked again in other arm and it was 115/52. Educated on reasons behind this and explained it was still within the parameters of our unit.   Pt stated she had a bad headache. Got her a Coke from [employer] and after drinking some of this her headache is going away.   Complaining of pain/uncomfortable feeding in vagina/urethra and menstrual-like cramps. Explained the invasiveness of her surgery and the catheter is probably causing most of this uncomfortable feeling. Explained the orange-tinge to the urine from the pyridium.   Rechecked BP again at 0100 and it was 114/59. Encouraged pt to try to get some rest.   Will continue to monitor.

## 2019-02-19 MED ORDER — ALPRAZOLAM 0.5 MG PO TABS: 0.5000 mg | ORAL_TABLET | Freq: Every day | ORAL | 0 refills | Status: DC | PRN

## 2019-02-19 MED ORDER — HYDROCODONE-ACETAMINOPHEN 5-325 MG PO TABS: 1.0000 | ORAL_TABLET | Freq: Four times a day (QID) | ORAL | 0 refills | Status: DC | PRN

## 2019-02-19 MED ORDER — TRAMADOL HCL 50 MG PO TABS: 100.0000 mg | ORAL_TABLET | Freq: Four times a day (QID) | ORAL | 0 refills | Status: AC | PRN

## 2019-02-19 NOTE — Discharge Summary (Signed)
Gynecology Physician Postoperative Discharge Summary  Patient ID: Marie Booker MRN: [id number] DOB/AGE: 1965/09/29 53 y.o.  Admit Date: 02/17/2019 Discharge Date: 02/19/2019  Preoperative Diagnoses: PELVIC PAIN,HO ENDOMETRIOSIS, ENDOMETRIAL POLYP, PMB, STRESS INCONTINENCE, OBESITY   Procedures: Procedure(s) (LRB): LAPAROSCOPIC ASSISTED VAGINAL HYSTERECTOMY WITH BILATERAL SALPINGO OOPHORECTOMY (Bilateral) ANTERIOR REPAIR (CYSTOCELE) (N/A) CYSTOSCOPY (N/A) PUBO-VAGINAL SLING (N/A)  Hospital Course:  Marie Booker is a 53 y.o. G3P3  admitted for scheduled surgery.  She underwent the procedures as mentioned above, her operation was uncomplicated. For further details about surgery, please refer to the operative report. Patient had an uncomplicated postoperative course. By time of discharge on POD#2, her pain was controlled on oral pain medications; she was ambulating, voiding without difficulty, tolerating regular diet and passing flatus. She was deemed stable for discharge to home.   Significant Labs: CBC Latest Ref Rng & Units 02/18/2019 02/06/2019 11/13/2017  WBC 4.0 - 10.5 K/uL 10.9(H) 7.2 5.8  Hemoglobin 12.0 - 15.0 g/dL 11.7(L) 13.4 13.4  Hematocrit 36.0 - 46.0 % 35.6(L) 41.1 39.0  Platelets 150 - 400 K/uL 214 258 244   Urinalysis    Component Value Date/Time   COLORURINE YELLOW (A) 02/17/2019 1717   APPEARANCEUR CLEAR (A) 02/17/2019 1717   APPEARANCEUR Cloudy 09/19/2014 2224   LABSPEC 1.026 02/17/2019 1717   LABSPEC 1.030 09/19/2014 2224   PHURINE 5.0 02/17/2019 1717   GLUCOSEU NEGATIVE 02/17/2019 1717   GLUCOSEU Negative 09/19/2014 2224   HGBUR LARGE (A) 02/17/2019 1717   BILIRUBINUR NEGATIVE 02/17/2019 1717   BILIRUBINUR neg 10/16/2018 1122   BILIRUBINUR Negative 09/19/2014 2224   KETONESUR NEGATIVE 02/17/2019 1717   PROTEINUR NEGATIVE 02/17/2019 1717   UROBILINOGEN 0.2 10/16/2018 1122   NITRITE NEGATIVE 02/17/2019 1717   LEUKOCYTESUR NEGATIVE 02/17/2019 1717   LEUKOCYTESUR  Negative 09/19/2014 2224      Discharge Exam: Blood pressure (!) 110/57, pulse 70, temperature 97.6 F (36.4 C), temperature source Oral, resp. rate 18, last menstrual period 10/31/2016, SpO2 97 %. General appearance: alert and no distress  Resp: clear to auscultation bilaterally  Cardio: regular rate and rhythm  GI: soft, non-tender; bowel sounds normal; no masses, no organomegaly.  Incision: C/D/I, no erythema, no drainage noted Pelvic: scant blood on pad  Extremities: extremities normal, atraumatic, no cyanosis or edema and Homans sign is negative, no sign of DVT  Discharged Condition: Stable  Disposition: Discharge disposition: 01-Home or Self Care       Discharge Instructions    Discharge patient   Complete by: As directed    Discharge disposition: 01-Home or Self Care   Discharge patient date: 02/19/2019     Allergies as of 02/19/2019      Reactions   Dilaudid [hydromorphone Hcl] Itching   Pregabalin    Dizziness       Medication List    STOP taking these medications   conjugated estrogens vaginal cream Commonly known as: Premarin   medroxyPROGESTERone 10 MG tablet Commonly known as: PROVERA     TAKE these medications   ALPRAZolam 0.5 MG tablet Commonly known as: XANAX Take 1 tablet (0.5 mg total) by mouth daily as needed for anxiety.   cyanocobalamin 1000 MCG/ML injection Commonly known as: (VITAMIN B-12) Inject 1 mL (1,000 mcg total) into the muscle every 30 (thirty) days.   HYDROcodone-acetaminophen 5-325 MG tablet Commonly known as: Norco Take 1-2 tablets by mouth every 6 (six) hours as needed.   meloxicam 15 MG tablet Commonly known as: MOBIC Take 15 mg by mouth daily as  needed for pain.   miconazole 2 % powder Commonly known as: Lotrimin AF Apply topically as needed for itching.   nystatin-triamcinolone ointment Commonly known as: MYCOLOG Apply 1 application topically 2 (two) times daily.   ondansetron 4 MG disintegrating  tablet Commonly known as: Zofran ODT Take 1 tablet (4 mg total) by mouth every 6 (six) hours as needed for nausea.   rOPINIRole 2 MG tablet Commonly known as: REQUIP Take 2 mg by mouth at bedtime.   sertraline 100 MG tablet Commonly known as: ZOLOFT Take 100 mg by mouth at bedtime.   thyroid 120 MG tablet Commonly known as: ARMOUR Take 180 mg by mouth daily before breakfast.   traMADol 50 MG tablet Commonly known as: ULTRAM Take 2 tablets (100 mg total) by mouth every 6 (six) hours as needed for up to 7 days for severe pain. What changed:   how much to take  when to take this   Vitamin D (Ergocalciferol) 1.25 MG (50000 UT) Caps capsule Commonly known as: DRISDOL Take 50,000 Units by mouth 2 (two) times a week.      No future appointments. Follow-up Information    Hildred Laserherry, Robby Bulkley, MD Follow up in 1 week(s).   Specialties: Obstetrics and Gynecology, Radiology Why: Postoperative visit Contact information: [address] [place] [address] [phone]           Signed:  Hildred Laserherry, Akaylah Lalley, MD [facility]

## 2019-02-19 NOTE — Progress Notes (Signed)
Patient discharged home. Discharge instructions and prescriptions given and reviewed with patient. Patient verbalized understanding. Will be escorted out by staff. 

## 2019-02-19 NOTE — Progress Notes (Signed)
Post-Operative Day # 2, s/p TLH with BSO, urethral sling placement  Subjective: no complaints, up ad lib, voiding and tolerating PO.  Patient notes that she has done well with voiding after catheter was removed.  Had a bowel movement yesterday. Overall is feeling better today, but still noting some difficulty falling asleep.   Objective: Temp:  [97.6 F (36.4 C)-98.5 F (36.9 C)] 97.6 F (36.4 C) (09/02 0750) Pulse Rate:  [66-92] 70 (09/02 0750) Resp:  [16-20] 18 (09/02 0750) BP: (108-165)/(54-72) 110/57 (09/02 0750) SpO2:  [95 %-100 %] 97 % (09/02 0750)  Physical Exam:  General: alert and no distress  Lungs: clear to auscultation bilaterally Heart: regular rate and rhythm, S1, S2 normal, no murmur, click, rub or gallop Abdomen: soft, non-tender; bowel sounds normal; no masses,  no organomegaly Pelvis:Bleeding: appropriate,  Incision: healing well, no significant drainage, no dehiscence, no significant erythema Extremities: DVT Evaluation: No evidence of DVT seen on physical exam. Negative Homan's sign. No cords or calf tenderness. No significant calf/ankle edema.   CBC Latest Ref Rng & Units 02/18/2019 02/06/2019  WBC 4.0 - 10.5 K/uL 10.9(H) 7.2  Hemoglobin 12.0 - 15.0 g/dL 11.7(L) 13.4  Hematocrit 36.0 - 46.0 % 35.6(L) 41.1  Platelets 150 - 400 K/uL 214 258     Lab Results  Component Value Date   CREATININE 0.90 02/18/2019     Assessment/Plan: Continue routine post-operative care.   Pain better managed with PO meds.   Regular diet Continue to encourage ambulation Encourage use of incentive spirometry Discussed management options for her sleep. Notes that her Xanax did help some last night, but does not like to take medication that much (reports that her last refill for Xanax was several years ago , and still has a few pills left in the bottle).  Desires a new prescription as she feels like her old one may be expired. Will refill.  Dispo: home today    [name],  MD [facility]

## 2019-02-25 ENCOUNTER — Other Ambulatory Visit: Payer: Self-pay

## 2019-02-25 ENCOUNTER — Encounter: Payer: Self-pay | Admitting: Obstetrics and Gynecology

## 2019-02-25 ENCOUNTER — Ambulatory Visit (INDEPENDENT_AMBULATORY_CARE_PROVIDER_SITE_OTHER): Payer: BLUE CROSS/BLUE SHIELD | Admitting: Obstetrics and Gynecology

## 2019-02-25 VITALS — BP 138/84 | HR 92 | Ht 70.0 in | Wt 272.8 lb

## 2019-02-25 DIAGNOSIS — N393 Stress incontinence (female) (male): Secondary | ICD-10-CM

## 2019-02-25 DIAGNOSIS — Z4889 Encounter for other specified surgical aftercare: Secondary | ICD-10-CM

## 2019-02-25 DIAGNOSIS — R399 Unspecified symptoms and signs involving the genitourinary system: Secondary | ICD-10-CM | POA: Diagnosis not present

## 2019-02-25 DIAGNOSIS — Z90710 Acquired absence of both cervix and uterus: Secondary | ICD-10-CM

## 2019-02-25 LAB — POCT URINALYSIS DIPSTICK
Bilirubin, UA: NEGATIVE
Glucose, UA: NEGATIVE
Ketones, UA: NEGATIVE
Nitrite, UA: NEGATIVE
Protein, UA: POSITIVE — AB
Spec Grav, UA: >=1.03 — AB (ref 1.010–1.025)
Urobilinogen, UA: 0.2 E.U./dL
pH, UA: 5 (ref 5.0–8.0)

## 2019-02-25 NOTE — Progress Notes (Addendum)
OBSTETRICS/GYNECOLOGY POST-OPERATIVE CLINIC VISIT  Subjective:     Marie Booker is a 53 y.o. female who presents to the clinic 1 weeks status post laparoscopic assisted vaginal hysterectomy and BSO, with urethral sling placement for pelvic pain and history of PMB, endometrial polyp, and stress incontinence. Eating a regular diet without difficulty. Bowel movements are normal, taking Colace twice daily as prescribed to prevent constipation as she does have a history. Pain is controlled with current analgesics. Medications being used: prescription NSAID's including ibuprofen (Motrin) and narcotic analgesics including tramadol (Ultram) and Vicodin.   Of note, patient notes that she has lost 8 lbs since her surgery. She notes that she desires to continue weight loss progress, is concerned that post-surgery she will start gaining weight although she has already gone through menopause.    Reports that she has to concentrate to use the restroom. Denies any urinary retention but does feel like it may take up to 1 minute to initiate a urinary stream.  Has not yet begun doing Kegel exercises yet. Does still also note some occasional burning with urination  Lastly she notes that one of her incision appears slightly red on the left side. Denies any tenderness or drainage.  The following portions of the patient's history were reviewed and updated as appropriate: allergies, current medications, past family history, past medical history, past social history, past surgical history and problem list.  Review of Systems Pertinent items noted in HPI and remainder of comprehensive ROS otherwise negative.    Objective:    BP 138/84   Pulse 92   Ht 5\' 10"  (1.778 m)   Wt 272 lb 12.8 oz (123.7 kg)   LMP 10/31/2016   BMI 39.14 kg/m  General:  alert and no distress  Abdomen: soft, bowel sounds active, non-tender  Incision:   healing well, no drainage,, no hernia, no seroma, no swelling, no dehiscence, incision  well approximated.  Mild redness of left laparoscopic port site but no warmth, overall healing well, non-tender, scab in place.     Pathology:   A. UTERUS AND CERVIX WITH BILATERAL TUBES AND OVARIES; TOTAL  HYSTERECTOMY WITH BILATERAL SALPINGO-OOPHORECTOMY:  - CERVIX: NABOTHIAN CYSTS. NEGATIVE FOR DYSPLASIA AND MALIGNANCY.  - ENDOMETRIUM: INACTIVE. NEGATIVE FOR ATYPIA / EIN AND MALIGNANCY.  - MYOMETRIUM: UNREMARKABLE.  - BILATERAL FALLOPIAN TUBES: UNREMARKABLE. NEGATIVE FOR ATYPIA AND  MALIGNANCY.  - BILATERAL OVARIES: BENIGN PHYSIOLOGIC CHANGES. NEGATIVE FOR ATYPIA AND  MALIGNANCY.     Labs: Results for orders placed or performed in visit on 02/25/19  POCT urinalysis dipstick  Result Value Ref Range   Color, UA yellow    Clarity, UA clear    Glucose, UA Negative Negative   Bilirubin, UA neg    Ketones, UA neg    Spec Grav, UA >=1.030 (A) 1.010 - 1.025   Blood, UA 3+    pH, UA 5.0 5.0 - 8.0   Protein, UA Positive (A) Negative   Urobilinogen, UA 0.2 0.2 or 1.0 E.U./dL   Nitrite, UA neg    Leukocytes, UA Moderate (2+) (A) Negative   Appearance yellow    Odor       Assessment:    Encounter for postoperative wound check  Status post laparoscopic hysterectomy  Stress incontinence  UTI symptoms - Plan: POCT urinalysis dipstick   Plan:   1. Continue any current medications.  Notes that she uses the Vicodin just at night to help her sleep after being up and active during the day  so that she is not in pain. Can tolerate pain with just Ultram during the day.  2. Wound care discussed.  Advised on use of Neosporin to left incision. No signs of active infection.  3. Operative findings again reviewed. Pathology report discussed. 4. Activity restrictions: no bending, stooping, or squatting and no lifting more than 10-15 pounds. Plans to go to the beach this weekend, advised not to submerge in water.  5. Burning with urination, UA normal except for presence of blood, patient  still notes that she is still having some light vaginal bleeding as well. Advised that this will resolve with time. May also be secondary to the sling in place.  6. Anticipated return to work: 2-3 weeks. 7.  Follow up: 5 weeks for final post-operative check    [name], MD [facility]

## 2019-02-25 NOTE — Progress Notes (Signed)
Pt is present for post-op. Pt stated that her left side incision is red, no itching, no oozing or discharge coming from the area.

## 2019-02-26 ENCOUNTER — Encounter: Payer: BLUE CROSS/BLUE SHIELD | Admitting: Obstetrics and Gynecology

## 2019-03-12 ENCOUNTER — Telehealth: Payer: Self-pay | Admitting: Obstetrics and Gynecology

## 2019-03-12 MED ORDER — NYSTATIN 100000 UNIT/GM EX CREA: TOPICAL_CREAM | Freq: Two times a day (BID) | CUTANEOUS | 0 refills | Status: AC

## 2019-03-12 MED ORDER — FLUCONAZOLE 150 MG PO TABS: 150.0000 mg | ORAL_TABLET | Freq: Once | ORAL | 0 refills | Status: AC

## 2019-03-12 NOTE — Telephone Encounter (Signed)
Spoke with pt and she stated having itching in the vaginal and under her breast. Pt was sent in diflucan 150mg  one single dose and nystatin cream for breast area to [facility].

## 2019-03-12 NOTE — Telephone Encounter (Signed)
The patient called and stated that she is 3 weeks post op from having surgery and she is currently experiencing what she thinks is a bad yeast infection. Pt has very heavy discharge with a strong odor, and vaginal itching. Pt is requesting a call back today if possible please advise.

## 2019-03-31 NOTE — Progress Notes (Signed)
Pt is present today for her 5 week post op visit after surgery. Pt stated that her incisions has healed up well. Pt stated checking her urine at work and noticing blood in her urine. Pt had UA done in office today.

## 2019-04-01 ENCOUNTER — Encounter: Payer: Self-pay | Admitting: Obstetrics and Gynecology

## 2019-04-01 ENCOUNTER — Ambulatory Visit (INDEPENDENT_AMBULATORY_CARE_PROVIDER_SITE_OTHER): Payer: BLUE CROSS/BLUE SHIELD | Admitting: Obstetrics and Gynecology

## 2019-04-01 VITALS — BP 143/87 | HR 77 | Ht 70.0 in | Wt 279.1 lb

## 2019-04-01 DIAGNOSIS — Z4889 Encounter for other specified surgical aftercare: Secondary | ICD-10-CM

## 2019-04-01 DIAGNOSIS — R319 Hematuria, unspecified: Secondary | ICD-10-CM | POA: Diagnosis not present

## 2019-04-01 DIAGNOSIS — Z6841 Body Mass Index (BMI) 40.0 and over, adult: Secondary | ICD-10-CM

## 2019-04-01 LAB — POCT URINALYSIS DIPSTICK
Glucose, UA: NEGATIVE
Ketones, UA: NEGATIVE
Nitrite, UA: NEGATIVE
Protein, UA: POSITIVE — AB
Spec Grav, UA: >=1.03 — AB (ref 1.010–1.025)
Urobilinogen, UA: 0.2 E.U./dL
pH, UA: 6 (ref 5.0–8.0)

## 2019-04-01 MED ORDER — BUPROPION HCL ER (XL) 150 MG PO TB24: 150.0000 mg | ORAL_TABLET | Freq: Every day | ORAL | 6 refills | Status: DC

## 2019-04-01 MED ORDER — PHENTERMINE HCL 37.5 MG PO CAPS: 37.5000 mg | ORAL_CAPSULE | ORAL | 0 refills | Status: DC

## 2019-04-01 NOTE — Progress Notes (Deleted)
    GYNECOLOGY PROGRESS NOTE  Subjective:    Patient ID: Marie Booker, female    DOB: [birth year], 53 y.o.   MRN: [id number]  HPI  Patient is a 53 y.o. G3P3 female who presents for   180 lbs goal weight.   {Common ambulatory SmartLinks:19316}  Review of Systems {ros; complete:30496}   Objective:   Blood pressure (!) 143/87, pulse 77, height 5\' 10"  (1.778 m), weight 279 lb 1.6 oz (126.6 kg), last menstrual period 10/31/2016. Body mass index is 40.05 kg/m.  General appearance: {general exam:16600} Abdomen: {abdominal exam:16834} Pelvic: {pelvic exam:16852::"cervix normal in appearance","external genitalia normal","no adnexal masses or tenderness","no cervical motion tenderness","rectovaginal septum normal","uterus normal size, shape, and consistency","vagina normal without discharge"} Extremities: {extremity exam:5109} Neurologic: {neuro exam:17854}   Assessment:    Plan:

## 2019-04-01 NOTE — Progress Notes (Signed)
OBSTETRICS/GYNECOLOGY POST-OPERATIVE CLINIC VISIT  Subjective:     Marie Booker is a 53 y.o. female who presents to the clinic 6 weeks status post laparoscopic assisted vaginal hysterectomy and BSO, with urethral sling placement for pelvic pain and history of PMB, endometrial polyp, and stress incontinence. Eating a regular diet without difficulty. Bowel movements are normal (with aid of stool softeners). Patient is no longer experiencing the pelvic pain.  She is still noting some left side pain but thinks this is due to her history of chronic back pain.  Is seeing a specialist for this. Medications being used: prescription NSAID's including ibuprofen (Mobic) and narcotic analgesics including tramadol (Ultram).   Patient reports concerns that she is still noting occasional small amounts of blood when voiding. Wonders if this is normal. Notes that she is not drinking as much water as she should.  She is noting that she is able to hold her urine much better and her nocturia has improved.   Lastly, patient desires to discuss weight loss management.  Has been on weight loss medications (tried Phentermine) before in the past.  Notes that she really desires to try to lose weight again to help with her back pain.    The following portions of the patient's history were reviewed and updated as appropriate: allergies, current medications, past family history, past medical history, past social history, past surgical history and problem list.  Review of Systems Pertinent items noted in HPI and remainder of comprehensive ROS otherwise negative.    Objective:    BP (!) 143/87   Pulse 77   Ht 5\' 10"  (1.778 m)   Wt 279 lb 1.6 oz (126.6 kg)   LMP 10/31/2016   BMI 40.05 kg/m  General:  alert and no distress  Abdomen: soft, bowel sounds active, non-tender  Incision:   healing well, no drainage,, no hernia, no seroma, no swelling, no dehiscence, incision well approximated.  .   Pelvis:  External genitalia  normal, vagina with scant thin yellow-brown discharge in the vaginal vault. Vaginal vault healing well. Uterus, cervix, and bilateral fallopian tubes surgically absent.     Pathology:  A. UTERUS AND CERVIX WITH BILATERAL TUBES AND OVARIES; TOTAL  HYSTERECTOMY WITH BILATERAL SALPINGO-OOPHORECTOMY:  - CERVIX: NABOTHIAN CYSTS. NEGATIVE FOR DYSPLASIA AND MALIGNANCY.  - ENDOMETRIUM: INACTIVE. NEGATIVE FOR ATYPIA / EIN AND MALIGNANCY.  - MYOMETRIUM: UNREMARKABLE.  - BILATERAL FALLOPIAN TUBES: UNREMARKABLE. NEGATIVE FOR ATYPIA AND  MALIGNANCY.  - BILATERAL OVARIES: BENIGN PHYSIOLOGIC CHANGES. NEGATIVE FOR ATYPIA AND  MALIGNANCY.     Labs: Results for orders placed or performed in visit on 04/01/19  POCT urinalysis dipstick  Result Value Ref Range   Color, UA yellow    Clarity, UA clear    Glucose, UA Negative Negative   Bilirubin, UA 1+    Ketones, UA neg    Spec Grav, UA >=1.030 (A) 1.010 - 1.025   Blood, UA hemo mod    pH, UA 6.0 5.0 - 8.0   Protein, UA Positive (A) Negative   Urobilinogen, UA 0.2 0.2 or 1.0 E.U./dL   Nitrite, UA neg    Leukocytes, UA Moderate (2+) (A) Negative   Appearance yellow    Odor       Assessment:    Encounter for postoperative wound check  Hematuria, unspecified type - Plan: POCT urinalysis dipstick, Urine Culture  Morbid obesity with BMI of 40.0-44.9, adult (HCC)   Plan:   1. Will check patient's urine to rule out  UTI. Also noted scant dark brown discharge in the vagina, bleeding in urine may be secondary to this as well.  2. Operative findings again reviewed. Pathology report discussed. 3. Activity restrictions: pelvic rest x 1 additional week. Can resume slowly all other activities, including exercise.  4. Discussed weight loss management with the patient.  Had success with weight loss in the past, however after stopping the medication she notes she gained the weight back. Advised that weight loss included lifestyle and dietary changes.   Patient states that she is willing to make the necessary changes.  Tried Phentermine last time. Advised that she can resume this, and additionally use Wellbutrin to help with appetite suppression (as patient notes that since having her hysterectomy she has had an increase in her appetite). Already receives B12 injections and Vitamin D supplementation due to her history of deficiency. Will prescribe Phentermine and Wellbutrin. To return in 1 month for reassessment.  Goal weight is 180 lbs (total of ~ 100 lbs).  If no improvement in weight loss, will also refer to dietician.     [name], MD [facility]

## 2019-04-01 NOTE — Patient Instructions (Signed)
Preventing Unhealthy Weight Gain, Adult °Staying at a healthy weight is important to your overall health. When fat builds up in your body, you may become overweight or obese. Being overweight or obese increases your risk of developing certain health problems, such as heart disease, diabetes, sleeping problems, joint problems, and some types of cancer. °Unhealthy weight gain is often the result of making unhealthy food choices or not getting enough exercise. You can make changes to your lifestyle to prevent obesity and stay as healthy as possible. °What nutrition changes can be made? ° °· Eat only as much as your body needs. To do this: °? Pay attention to signs that you are hungry or full. Stop eating as soon as you feel full. °? If you feel hungry, try drinking water first before eating. Drink enough water so your urine is clear or pale yellow. °? Eat smaller portions. Pay attention to portion sizes when eating out. °? Look at serving sizes on food labels. Most foods contain more than one serving per container. °? Eat the recommended number of calories for your gender and activity level. For most active people, a daily total of 2,000 calories is appropriate. If you are trying to lose weight or are not very active, you may need to eat fewer calories. Talk with your health care provider or a diet and nutrition specialist (dietitian) about how many calories you need each day. °· Choose healthy foods, such as: °? Fruits and vegetables. At each meal, try to fill at least half of your plate with fruits and vegetables. °? Whole grains, such as whole-wheat bread, brown rice, and quinoa. °? Lean meats, such as chicken or fish. °? Other healthy proteins, such as beans, eggs, or tofu. °? Healthy fats, such as nuts, seeds, fatty fish, and olive oil. °? Low-fat or fat-free dairy products. °· Check food labels, and avoid food and drinks that: °? Are high in calories. °? Have added sugar. °? Are high in sodium. °? Have saturated  fats or trans fats. °· Cook foods in healthier ways, such as by baking, broiling, or grilling. °· Make a meal plan for the week, and shop with a grocery list to help you stay on track with your purchases. Try to avoid going to the grocery store when you are hungry. °· When grocery shopping, try to shop around the outside of the store first, where the fresh foods are. Doing this helps you to avoid prepackaged foods, which can be high in sugar, salt (sodium), and fat. °What lifestyle changes can be made? ° °· Exercise for 30 or more minutes on 5 or more days each week. Exercising may include brisk walking, yard work, biking, running, swimming, and team sports like basketball and soccer. Ask your health care provider which exercises are safe for you. °· Do muscle-strengthening activities, such as lifting weights or using resistance bands, on 2 or more days a week. °· Do not use any products that contain nicotine or tobacco, such as cigarettes and e-cigarettes. If you need help quitting, ask your health care provider. °· Limit alcohol intake to no more than 1 drink a day for nonpregnant women and 2 drinks a day for men. One drink equals 12 oz of beer, 5 oz of wine, or 1½ oz of hard liquor. °· Try to get 7-9 hours of sleep each night. °What other changes can be made? °· Keep a food and activity journal to keep track of: °? What you ate and how many calories   you had. Remember to count the calories in sauces, dressings, and side dishes. °? Whether you were active, and what exercises you did. °? Your calorie, weight, and activity goals. °· Check your weight regularly. Track any changes. If you notice you have gained weight, make changes to your diet or activity routine. °· Avoid taking weight-loss medicines or supplements. Talk to your health care provider before starting any new medicine or supplement. °· Talk to your health care provider before trying any new diet or exercise plan. °Why are these changes  important? °Eating healthy, staying active, and having healthy habits can help you to prevent obesity. Those changes also: °· Help you manage stress and emotions. °· Help you connect with friends and family. °· Improve your self-esteem. °· Improve your sleep. °· Prevent long-term health problems. °What can happen if changes are not made? °Being obese or overweight can cause you to develop joint or bone problems, which can make it hard for you to stay active or do activities you enjoy. Being obese or overweight also puts stress on your heart and lungs and can lead to health problems like diabetes, heart disease, and some cancers. °Where to find more information °Talk with your health care provider or a dietitian about healthy eating and healthy lifestyle choices. You may also find information from: °· U.[place] Department of Agriculture, MyPlate: www.choosemyplate.gov °· American Heart Association: www.heart.org °· Centers for Disease Control and Prevention: www.cdc.gov °Summary °· Staying at a healthy weight is important to your overall health. It helps you to prevent certain diseases and health problems, such as heart disease, diabetes, joint problems, sleep disorders, and some types of cancer. °· Being obese or overweight can cause you to develop joint or bone problems, which can make it hard for you to stay active or do activities you enjoy. °· You can prevent unhealthy weight gain by eating a healthy diet, exercising regularly, not smoking, limiting alcohol, and getting enough sleep. °· Talk with your health care provider or a dietitian for guidance about healthy eating and healthy lifestyle choices. °This information is not intended to replace advice given to you by your health care provider. Make sure you discuss any questions you have with your health care provider. °Document Released: 06/06/2016 Document Revised: 06/08/2017 Document Reviewed: 07/12/2016 °Elsevier Patient Education © 2020 [employer] ° °

## 2019-04-03 ENCOUNTER — Other Ambulatory Visit: Payer: Self-pay | Admitting: Obstetrics and Gynecology

## 2019-04-03 LAB — URINE CULTURE: Organism ID, Bacteria: NO GROWTH

## 2019-04-04 NOTE — Telephone Encounter (Signed)
Please advise. Thanks Lana Flaim 

## 2019-05-01 ENCOUNTER — Telehealth: Payer: Self-pay

## 2019-05-01 NOTE — Telephone Encounter (Signed)
Pt had to cancel appt 05/02/19 pt states 10 weeks ago hysterectomy. Pt states burning intercourse last night and this morning bad odor. Pt says she will come tomorrow afternoon after 11:00 if cancellation she has 10 am meeting tomorrow and had to cancel 9:30 am appt. Pt states she thinks she needs antibiotic. Vaginal discharge. Pain left side. Other provider is treating for spine but it is worse.

## 2019-05-02 ENCOUNTER — Encounter: Payer: BLUE CROSS/BLUE SHIELD | Admitting: Obstetrics and Gynecology

## 2019-06-02 ENCOUNTER — Other Ambulatory Visit: Payer: Self-pay | Admitting: Family

## 2019-06-02 DIAGNOSIS — Z1231 Encounter for screening mammogram for malignant neoplasm of breast: Secondary | ICD-10-CM

## 2019-09-06 ENCOUNTER — Ambulatory Visit: Payer: BLUE CROSS/BLUE SHIELD

## 2019-09-16 ENCOUNTER — Encounter: Payer: Self-pay | Admitting: Obstetrics and Gynecology

## 2019-09-16 ENCOUNTER — Ambulatory Visit (INDEPENDENT_AMBULATORY_CARE_PROVIDER_SITE_OTHER): Payer: BLUE CROSS/BLUE SHIELD | Admitting: Obstetrics and Gynecology

## 2019-09-16 ENCOUNTER — Other Ambulatory Visit: Payer: Self-pay

## 2019-09-16 VITALS — BP 160/83 | HR 79 | Ht 70.0 in | Wt 277.5 lb

## 2019-09-16 DIAGNOSIS — R1032 Left lower quadrant pain: Secondary | ICD-10-CM

## 2019-09-16 DIAGNOSIS — R399 Unspecified symptoms and signs involving the genitourinary system: Secondary | ICD-10-CM | POA: Diagnosis not present

## 2019-09-16 DIAGNOSIS — N952 Postmenopausal atrophic vaginitis: Secondary | ICD-10-CM | POA: Diagnosis not present

## 2019-09-16 DIAGNOSIS — N941 Unspecified dyspareunia: Secondary | ICD-10-CM | POA: Diagnosis not present

## 2019-09-16 DIAGNOSIS — R102 Pelvic and perineal pain: Secondary | ICD-10-CM | POA: Diagnosis not present

## 2019-09-16 LAB — POCT URINALYSIS DIPSTICK
Bilirubin, UA: NEGATIVE
Glucose, UA: NEGATIVE
Ketones, UA: NEGATIVE
Leukocytes, UA: NEGATIVE
Nitrite, UA: NEGATIVE
Protein, UA: POSITIVE — AB
Spec Grav, UA: >=1.03 — AB (ref 1.010–1.025)
Urobilinogen, UA: 0.2 E.U./dL
pH, UA: 6 (ref 5.0–8.0)

## 2019-09-16 MED ORDER — AMITRIPTYLINE HCL 25 MG PO TABS: 25.0000 mg | ORAL_TABLET | Freq: Every day | ORAL | 3 refills | Status: DC

## 2019-09-16 NOTE — Progress Notes (Signed)
Pt is present due to having uti symptoms for 2-3 weeks. Pt stated that she seen her PCP last week and had an UA completed. Pt also c/o left side pain, pain with sex, burning and pain with urination. UA completed and documented.

## 2019-09-16 NOTE — Progress Notes (Signed)
GYNECOLOGY PROGRESS NOTE  Subjective:    Patient ID: Marie Booker, female    DOB: 02-03-1966, 54 y.o.   MRN: [id number]  HPI  Patient is a 54 y.o. [place] female who presents for complaints of burning sensation and pain for the past 2-3 weeks in her left abdomen and side  Notes that this is similar pain that she was having prior to her hysterectomy.  Patient reports that her PCP has referred her for a colonoscopy as she has had negative findings during laparoscopy with hysterectomy.   She is also complaining of burning pain with intercourse.  She has been seen by Urology due to burning sensation with urination and h/o hematuria, but reports negative workup.  Recently seen by PCP last week due to symptoms, but notes otherwise normal urine except hematuria. Does have urethral sling in place.    The following portions of the patient's history were reviewed and updated as appropriate: allergies, current medications, past family history, past medical history, past social history, past surgical history and problem list.  Review of Systems Pertinent items noted in HPI and remainder of comprehensive ROS otherwise negative.   Objective:   Blood pressure (!) 160/83, pulse 79, height 5\' 10"  (1.778 m), weight 277 lb 8 oz (125.9 kg), last menstrual period 10/31/2016. General appearance: alert and no distress Abdomen: mild tenderness in mid and lower abdomen on left.  Bowel sounds present.  Pelvic: external genitalia normal, rectovaginal septum normal.  Vagina without discharge, mild to moderate vaginal atrophy.  Cervix normal appearing, no lesions and no motion tenderness.  Uterus mobile, nontender, normal shape and size.  Adnexae non-palpable, nontender bilaterally.  Extremities: extremities normal, atraumatic, no cyanosis or edema Neurologic: Grossly normal   Labs:  Results for orders placed or performed in visit on 09/16/19  POCT urinalysis dipstick  Result Value Ref Range   Color, UA yellow    Clarity, UA clear    Glucose, UA Negative Negative   Bilirubin, UA neg    Ketones, UA neg    Spec Grav, UA >=1.030 (A) 1.010 - 1.025   Blood, UA hemo trace +2    pH, UA 6.0 5.0 - 8.0   Protein, UA Positive (A) Negative   Urobilinogen, UA 0.2 0.2 or 1.0 E.U./dL   Nitrite, UA neg    Leukocytes, UA Negative Negative   Appearance yellow;clear    Odor      Assessment:   1. UTI symptoms   2. Pelvic pain   3. Dyspareunia in female   4. Vaginal atrophy   5. Left lower quadrant abdominal pain     Plan:   1. UTI symptoms - patient with irritative voiding symptoms, has been ongoing for over a year. Has had negative workup in the past, especially in light of hematuria. Will send urine for culture to assess for asymptomatic bacteruria. Discussed option of trial of a TCA to see if this will help bladder symptoms. Will prescribe 3 months of Elavil.  2. Pelvic pain and dyspareunia - has had a history of endometriosis (although no implants visible at time of hysterectomy).  Will refer to pelvic floor physical therapy. Also encouraged use of Premarin cream for vaginal atrophy as this can also lead to current symptoms.  3.  Left lower quadrant pain - patient plans on having colonoscopy.  Also, notes that she is undergoing assessment for weight loss surgery, as she knows that her weight may have some bearing on some of her medical issues.  Return for 3-4 weeks f/u bladder symptoms.   Hildred Laser, MD [facility]

## 2019-09-18 ENCOUNTER — Encounter: Payer: Self-pay | Admitting: Obstetrics and Gynecology

## 2019-09-28 ENCOUNTER — Emergency Department: Payer: BLUE CROSS/BLUE SHIELD

## 2019-09-28 ENCOUNTER — Emergency Department
Admission: EM | Admit: 2019-09-28 | Discharge: 2019-09-28 | Disposition: A | Discharge status: Home / Self Care | Payer: BLUE CROSS/BLUE SHIELD | Attending: Emergency Medicine | Admitting: Emergency Medicine

## 2019-09-28 ENCOUNTER — Other Ambulatory Visit: Payer: Self-pay

## 2019-09-28 ENCOUNTER — Encounter: Payer: Self-pay | Admitting: Emergency Medicine

## 2019-09-28 DIAGNOSIS — R109 Unspecified abdominal pain: Secondary | ICD-10-CM | POA: Insufficient documentation

## 2019-09-28 DIAGNOSIS — M545 Low back pain, unspecified: Secondary | ICD-10-CM

## 2019-09-28 DIAGNOSIS — E039 Hypothyroidism, unspecified: Secondary | ICD-10-CM | POA: Diagnosis not present

## 2019-09-28 DIAGNOSIS — Z79899 Other long term (current) drug therapy: Secondary | ICD-10-CM | POA: Diagnosis not present

## 2019-09-28 LAB — URINALYSIS, COMPLETE (UACMP) WITH MICROSCOPIC
Bilirubin Urine: NEGATIVE
Glucose, UA: NEGATIVE mg/dL
Hgb urine dipstick: NEGATIVE
Ketones, ur: NEGATIVE mg/dL
Leukocytes,Ua: NEGATIVE
Nitrite: NEGATIVE
Protein, ur: NEGATIVE mg/dL
Specific Gravity, Urine: 1.015 (ref 1.005–1.030)
pH: 5 (ref 5.0–8.0)

## 2019-09-28 LAB — COMPREHENSIVE METABOLIC PANEL
ALT: 16 U/L (ref 0–44)
AST: 17 U/L (ref 15–41)
Albumin: 3.7 g/dL (ref 3.5–5.0)
Alkaline Phosphatase: 70 U/L (ref 38–126)
Anion gap: 7 (ref 5–15)
BUN: 20 mg/dL (ref 6–20)
CO2: 24 mmol/L (ref 22–32)
Calcium: 8.8 mg/dL — ABNORMAL LOW (ref 8.9–10.3)
Chloride: 107 mmol/L (ref 98–111)
Creatinine, Ser: 0.83 mg/dL (ref 0.44–1.00)
GFR calc Af Amer: >60 mL/min (ref >60)
GFR calc non Af Amer: >60 mL/min (ref >60)
Glucose, Bld: 94 mg/dL (ref 70–99)
Potassium: 3.5 mmol/L (ref 3.5–5.1)
Sodium: 138 mmol/L (ref 135–145)
Total Bilirubin: 0.6 mg/dL (ref 0.3–1.2)
Total Protein: 6.4 g/dL — ABNORMAL LOW (ref 6.5–8.1)

## 2019-09-28 LAB — CBC
HCT: 38.3 % (ref 36.0–46.0)
Hemoglobin: 12.6 g/dL (ref 12.0–15.0)
MCH: 28.4 pg (ref 26.0–34.0)
MCHC: 32.9 g/dL (ref 30.0–36.0)
MCV: 86.5 fL (ref 80.0–100.0)
Platelets: 223 10*3/uL (ref 150–400)
RBC: 4.43 MIL/uL (ref 3.87–5.11)
RDW: 13.3 % (ref 11.5–15.5)
WBC: 8 10*3/uL (ref 4.0–10.5)
nRBC: 0 % (ref 0.0–0.2)

## 2019-09-28 LAB — LIPASE, BLOOD: Lipase: 20 U/L (ref 11–51)

## 2019-09-28 MED ORDER — HYDROMORPHONE HCL 1 MG/ML IJ SOLN
1.0000 mg | Freq: Once | INTRAMUSCULAR | Status: AC
  Administered 2019-09-28: 12:00:00 1 mg via INTRAMUSCULAR
  Filled 2019-09-28: qty 1

## 2019-09-28 MED ORDER — FENTANYL CITRATE (PF) 100 MCG/2ML IJ SOLN
50.0000 ug | INTRAMUSCULAR | Status: AC | PRN
  Administered 2019-09-28 (×2): 50 ug via INTRAVENOUS
  Filled 2019-09-28 (×2): qty 2

## 2019-09-28 MED ORDER — IOHEXOL 300 MG/ML  SOLN
125.0000 mL | Freq: Once | INTRAMUSCULAR | Status: AC | PRN
  Administered 2019-09-28: 10:00:00 125 mL via INTRAVENOUS

## 2019-09-28 MED ORDER — HYDROMORPHONE HCL 1 MG/ML IJ SOLN
1.0000 mg | Freq: Once | INTRAMUSCULAR | Status: AC
  Administered 2019-09-28: 09:00:00 1 mg via INTRAVENOUS
  Filled 2019-09-28: qty 1

## 2019-09-28 MED ORDER — IOHEXOL 9 MG/ML PO SOLN
500.0000 mL | Freq: Two times a day (BID) | ORAL | Status: DC | PRN
  Administered 2019-09-28: 500 mL via ORAL

## 2019-09-28 NOTE — ED Triage Notes (Signed)
Pt arrives via [facility] with c/o back pain which is chronic but has increased since her colonoscopy Friday.

## 2019-09-28 NOTE — ED Notes (Signed)
Patient has been instructed that if waiting for transportation, patient must wait in lobby.

## 2019-09-28 NOTE — ED Provider Notes (Signed)
[facility] Emergency Department Provider Note  Time seen: 8:40 AM  I have reviewed the triage vital signs and the nursing notes.   HISTORY  Chief Complaint Back Pain    HPI RUARI DUGGAN is a 54 y.o. female with a past medical history of anxiety, arthritis, chronic back pain status post lumbar fusion presents to the emergency department for back pain and abdominal pain.  According to the patient she has chronic back pain which is usually controlled with Ultram.  Patient states she is also prescribed Vicodin to be used for breakthrough pain.  Patient states she has been taking her Ultram as well as Vicodin but is not able to control her pain per patient.  States it all started Friday when she had a routine colonoscopy.  States the colonoscopy showed no concerning findings.  However after waking from the colonoscopy she was experiencing right back pain.  She thought it could be positional from the procedure.  States the pain continued to worsen Friday evening and Saturday.  States she could not sleep last night due to the pain so she came to the emergency department today.  Patient also states she is experiencing mild lower abdominal pain.  Denies any dysuria or hematuria.  Has not had a bowel movement since the procedure.  Denies any vomiting.   Past Medical History:  Diagnosis Date  . Anemia    iron infusion 4 years ago due to heavy periods  . Anxiety   . Arthritis   . Asthma   . Family history of adverse reaction to anesthesia    mom-hard time waking up  . Hypothyroidism   . Pre-diabetes   . RLS (restless legs syndrome)   . Sleep apnea   . Thyroid disease     Patient Active Problem List   Diagnosis Date Noted  . Status post laparoscopic hysterectomy 02/17/2019  . History of uterine fibroid 09/24/2017  . Hypothyroidism 09/24/2017  . Obesity 09/24/2017  . OSA (obstructive sleep apnea) 09/24/2017  . Restless leg syndrome 09/24/2017  . Carpal tunnel syndrome  of left wrist 06/28/2017  . Osteoarthritis of carpometacarpal (CMC) joint of thumb 04/27/2017  . Anxiety 04/14/2016  . Bronchitis 06/10/2015  . Major depressive disorder 03/22/2015  . Iron deficiency anemia, unspecified 11/10/2014    Past Surgical History:  Procedure Laterality Date  . ABDOMINAL HYSTERECTOMY    . APPENDECTOMY    . BACK SURGERY     lower  . CARPAL TUNNEL RELEASE Left 11/07/2017   Procedure: CARPAL TUNNEL RELEASE;  Surgeon: Deeann Saint, MD;  Location: [facility] ORS;  Service: Orthopedics;  Laterality: Left;  . CYSTOCELE REPAIR N/A 02/17/2019   Procedure: ANTERIOR REPAIR (CYSTOCELE);  Surgeon: Hildred Laser, MD;  Location: [facility] ORS;  Service: Gynecology;  Laterality: N/A;  . CYSTOSCOPY N/A 02/17/2019   Procedure: CYSTOSCOPY;  Surgeon: Hildred Laser, MD;  Location: [facility] ORS;  Service: Gynecology;  Laterality: N/A;  . ENDOMETRIAL ABLATION    . LAPAROSCOPIC VAGINAL HYSTERECTOMY WITH SALPINGO OOPHORECTOMY Bilateral 02/17/2019   Procedure: LAPAROSCOPIC ASSISTED VAGINAL HYSTERECTOMY WITH BILATERAL SALPINGO OOPHORECTOMY;  Surgeon: Hildred Laser, MD;  Location: [facility] ORS;  Service: Gynecology;  Laterality: Bilateral;  . LEG SURGERY    . PUBOVAGINAL SLING N/A 02/17/2019   Procedure: Leonides Grills;  Surgeon: Hildred Laser, MD;  Location: [facility] ORS;  Service: Gynecology;  Laterality: N/A;    Prior to Admission medications   Medication Sig Start Date End Date Taking? Authorizing Provider  ALPRAZolam Prudy Feeler) 0.5 MG tablet  TAKE ONE TABLET EVERY DAY AS NEEDED FOR ANXIETY 04/04/19   Hildred Laser, MD  amitriptyline (ELAVIL) 25 MG tablet Take 1 tablet (25 mg total) by mouth at bedtime. 09/16/19   Hildred Laser, MD  buPROPion (WELLBUTRIN XL) 150 MG 24 hr tablet Take 1 tablet (150 mg total) by mouth daily. Patient not taking: Reported on 09/16/2019 04/01/19   Hildred Laser, MD  cyanocobalamin (,VITAMIN B-12,) 1000 MCG/ML injection Inject 1 mL (1,000 mcg total) into the muscle every 30 (thirty)  days. 08/13/18   Shambley, Melody N, CNM  HYDROcodone-acetaminophen (NORCO) 5-325 MG tablet Take 1-2 tablets by mouth every 6 (six) hours as needed. 02/19/19   Hildred Laser, MD  meloxicam (MOBIC) 15 MG tablet Take 15 mg by mouth daily as needed for pain.    [provider]  nystatin cream (MYCOSTATIN) Apply topically 2 (two) times daily. 03/12/19   Hildred Laser, MD  phentermine 37.5 MG capsule Take 1 capsule (37.5 mg total) by mouth every morning. Patient not taking: Reported on 09/16/2019 04/01/19   Hildred Laser, MD  rOPINIRole (REQUIP) 2 MG tablet Take 2 mg by mouth at bedtime.    [provider]  sertraline (ZOLOFT) 100 MG tablet Take 100 mg by mouth at bedtime.     [provider]  thyroid (ARMOUR) 120 MG tablet Take 180 mg by mouth daily before breakfast.     [provider]  Vitamin D, Ergocalciferol, (DRISDOL) 50000 units CAPS capsule Take 50,000 Units by mouth 2 (two) times a week.    [provider]    Allergies  Allergen Reactions  . Dilaudid [Hydromorphone Hcl] Itching  . Pregabalin     Dizziness     Family History  Problem Relation Age of Onset  . COPD Mother     Social History Social History   Tobacco Use  . Smoking status: Never Smoker  . Smokeless tobacco: Never Used  Substance Use Topics  . Alcohol use: No  . Drug use: No    Review of Systems Constitutional: Negative for fever. Cardiovascular: Negative for chest pain. Respiratory: Negative for shortness of breath. Gastrointestinal: Mild lower abdominal pain Genitourinary: Negative for urinary compaints Musculoskeletal: Moderate right lower back pain Neurological: Negative for headache All other ROS negative  ____________________________________________   PHYSICAL EXAM:  VITAL SIGNS: ED Triage Vitals  Enc Vitals Group     BP 09/28/19 0656 (!) 155/103     Pulse Rate 09/28/19 0656 70     Resp 09/28/19 0656 18     Temp 09/28/19 0656 97.7 F (36.5 C)      Temp Source 09/28/19 0656 Oral     SpO2 09/28/19 0656 100 %     Weight 09/28/19 0658 270 lb (122.5 kg)     Height 09/28/19 0658 5\' 10"  (1.778 m)     Head Circumference --      Peak Flow --      Pain Score 09/28/19 0658 10     Pain Loc --      Pain Edu? --      Excl. in GC? --    Constitutional: Awake alert.  Patient is oriented.  Patient is moaning and pain holding her right lower back. Eyes: Normal exam ENT      Head: Normocephalic and atraumatic.      Mouth/Throat: Mucous membranes are moist. Cardiovascular: Normal rate, regular rhythm.  Respiratory: Normal respiratory effort without tachypnea nor retractions. Breath sounds are clear Gastrointestinal: Soft, mild lower abdominal tenderness palpation  without rebound guarding or distention. Musculoskeletal: Mild to moderate tenderness of the lower back especially of the right paraspinal area. Neurologic:  Normal speech and language. No gross focal neurologic deficits Skin:  Skin is warm, dry and intact.  Psychiatric: Mood and affect are normal.   ____________________________________________   RADIOLOGY  CT scan is negative  ____________________________________________   INITIAL IMPRESSION / ASSESSMENT AND PLAN / ED COURSE  Pertinent labs & imaging results that were available during my care of the patient were reviewed by me and considered in my medical decision making (see chart for details).   Patient presents to the emergency department for back pain.  Patient also states lower abdominal pain.  Patient states her typical chronic back pain is usually in the lower back but often times left-sided.  Today it is more right-sided along with some lower abdominal discomfort.  Differential is quite broad would include acute on chronic back pain, radicular pain, intra-abdominal pathologies such as small perforation or abscess development, UTI.  We will check labs, urine sample obtain CT imaging the abdomen/pelvis to further evaluate.  We  will dose pain medication while awaiting results.  Patient agreeable to plan of care.  No lower extremity weakness or numbness.  No acute findings on lab work or CT imaging.  Highly suspect acute on chronic back pain.  Patient feels better after pain medication was states the pain has already started to come back.  States she has an appointment tomorrow morning with her pain management specialist.  We will dose 1 mg of Dilaudid intramuscularly.  Plan to discharge on the patient's home pain medication.  Patient agreeable to plan of care.  Provided my normal back pain return precautions.  Avila Albritton Saintil was evaluated in Emergency Department on 09/28/2019 for the symptoms described in the history of present illness. She was evaluated in the context of the global COVID-19 pandemic, which necessitated consideration that the patient might be at risk for infection with the SARS-CoV-2 virus that causes COVID-19. Institutional protocols and algorithms that pertain to the evaluation of patients at risk for COVID-19 are in a state of rapid change based on information released by regulatory bodies including the CDC and federal and state organizations. These policies and algorithms were followed during the patient's care in the ED.  ____________________________________________   FINAL CLINICAL IMPRESSION(S) / ED DIAGNOSES  Back pain   Harvest Dark, MD 09/28/19 1217

## 2019-09-28 NOTE — ED Notes (Signed)
Patient ambulated to bedside restroom without complication or assistance from staff or device.

## 2019-09-28 NOTE — ED Notes (Signed)
Pt able to finish 1 bottle of contrast. Pt unable to finish 2nd bottle. CT informed.

## 2019-09-28 NOTE — ED Notes (Signed)
Patient is requesting something to eat.

## 2019-10-08 ENCOUNTER — Encounter: Payer: BLUE CROSS/BLUE SHIELD | Admitting: Obstetrics and Gynecology

## 2020-08-17 ENCOUNTER — Other Ambulatory Visit: Payer: Self-pay

## 2020-08-17 ENCOUNTER — Other Ambulatory Visit: Payer: Self-pay | Admitting: Orthopedic Surgery

## 2020-08-17 ENCOUNTER — Ambulatory Visit
Admission: RE | Admit: 2020-08-17 | Discharge: 2020-08-17 | Disposition: A | Discharge status: Home / Self Care | Payer: BLUE CROSS/BLUE SHIELD | Source: Ambulatory Visit | Attending: Orthopedic Surgery | Admitting: Orthopedic Surgery

## 2020-08-17 DIAGNOSIS — M79605 Pain in left leg: Secondary | ICD-10-CM

## 2020-08-19 ENCOUNTER — Other Ambulatory Visit: Payer: Self-pay

## 2020-08-19 ENCOUNTER — Emergency Department: Payer: BLUE CROSS/BLUE SHIELD

## 2020-08-19 ENCOUNTER — Inpatient Hospital Stay
Admission: EM | Admit: 2020-08-19 | Discharge: 2020-08-23 | DRG: 863 | Disposition: A | Discharge status: Home / Self Care | Payer: BLUE CROSS/BLUE SHIELD | Attending: Family Medicine | Admitting: Family Medicine

## 2020-08-19 DIAGNOSIS — R21 Rash and other nonspecific skin eruption: Secondary | ICD-10-CM | POA: Diagnosis present

## 2020-08-19 DIAGNOSIS — Z96652 Presence of left artificial knee joint: Secondary | ICD-10-CM | POA: Diagnosis present

## 2020-08-19 DIAGNOSIS — E66813 Obesity, class 3: Secondary | ICD-10-CM

## 2020-08-19 DIAGNOSIS — Z825 Family history of asthma and other chronic lower respiratory diseases: Secondary | ICD-10-CM

## 2020-08-19 DIAGNOSIS — K5900 Constipation, unspecified: Secondary | ICD-10-CM | POA: Diagnosis present

## 2020-08-19 DIAGNOSIS — L03116 Cellulitis of left lower limb: Secondary | ICD-10-CM | POA: Diagnosis present

## 2020-08-19 DIAGNOSIS — Z888 Allergy status to other drugs, medicaments and biological substances status: Secondary | ICD-10-CM

## 2020-08-19 DIAGNOSIS — T402X5A Adverse effect of other opioids, initial encounter: Secondary | ICD-10-CM | POA: Diagnosis not present

## 2020-08-19 DIAGNOSIS — Z20822 Contact with and (suspected) exposure to covid-19: Secondary | ICD-10-CM | POA: Diagnosis present

## 2020-08-19 DIAGNOSIS — G4733 Obstructive sleep apnea (adult) (pediatric): Secondary | ICD-10-CM | POA: Diagnosis present

## 2020-08-19 DIAGNOSIS — G2581 Restless legs syndrome: Secondary | ICD-10-CM | POA: Diagnosis present

## 2020-08-19 DIAGNOSIS — T8141XA Infection following a procedure, superficial incisional surgical site, initial encounter: Principal | ICD-10-CM | POA: Diagnosis present

## 2020-08-19 DIAGNOSIS — Z6841 Body Mass Index (BMI) 40.0 and over, adult: Secondary | ICD-10-CM

## 2020-08-19 DIAGNOSIS — R7303 Prediabetes: Secondary | ICD-10-CM | POA: Diagnosis present

## 2020-08-19 DIAGNOSIS — Y92239 Unspecified place in hospital as the place of occurrence of the external cause: Secondary | ICD-10-CM | POA: Diagnosis not present

## 2020-08-19 DIAGNOSIS — M25562 Pain in left knee: Secondary | ICD-10-CM

## 2020-08-19 DIAGNOSIS — T50905A Adverse effect of unspecified drugs, medicaments and biological substances, initial encounter: Secondary | ICD-10-CM

## 2020-08-19 DIAGNOSIS — F32A Depression, unspecified: Secondary | ICD-10-CM | POA: Diagnosis present

## 2020-08-19 DIAGNOSIS — B379 Candidiasis, unspecified: Secondary | ICD-10-CM | POA: Diagnosis present

## 2020-08-19 DIAGNOSIS — L509 Urticaria, unspecified: Secondary | ICD-10-CM

## 2020-08-19 DIAGNOSIS — R502 Drug induced fever: Secondary | ICD-10-CM | POA: Diagnosis not present

## 2020-08-19 DIAGNOSIS — F419 Anxiety disorder, unspecified: Secondary | ICD-10-CM | POA: Diagnosis present

## 2020-08-19 DIAGNOSIS — Z79899 Other long term (current) drug therapy: Secondary | ICD-10-CM

## 2020-08-19 DIAGNOSIS — J45909 Unspecified asthma, uncomplicated: Secondary | ICD-10-CM | POA: Diagnosis present

## 2020-08-19 DIAGNOSIS — E039 Hypothyroidism, unspecified: Secondary | ICD-10-CM | POA: Diagnosis present

## 2020-08-19 DIAGNOSIS — Z8619 Personal history of other infectious and parasitic diseases: Secondary | ICD-10-CM

## 2020-08-19 DIAGNOSIS — Z791 Long term (current) use of non-steroidal anti-inflammatories (NSAID): Secondary | ICD-10-CM

## 2020-08-19 DIAGNOSIS — M25462 Effusion, left knee: Secondary | ICD-10-CM | POA: Diagnosis present

## 2020-08-19 LAB — LACTIC ACID, PLASMA: Lactic Acid, Venous: 1.4 mmol/L (ref 0.5–1.9)

## 2020-08-19 LAB — COMPREHENSIVE METABOLIC PANEL
ALT: 35 U/L (ref 0–44)
AST: 49 U/L — ABNORMAL HIGH (ref 15–41)
Albumin: 3.5 g/dL (ref 3.5–5.0)
Alkaline Phosphatase: 80 U/L (ref 38–126)
Anion gap: 10 (ref 5–15)
BUN: 18 mg/dL (ref 6–20)
CO2: 21 mmol/L — ABNORMAL LOW (ref 22–32)
Calcium: 8.8 mg/dL — ABNORMAL LOW (ref 8.9–10.3)
Chloride: 103 mmol/L (ref 98–111)
Creatinine, Ser: 0.96 mg/dL (ref 0.44–1.00)
GFR, Estimated: >60 mL/min (ref >60)
Glucose, Bld: 101 mg/dL — ABNORMAL HIGH (ref 70–99)
Potassium: 3.8 mmol/L (ref 3.5–5.1)
Sodium: 134 mmol/L — ABNORMAL LOW (ref 135–145)
Total Bilirubin: 1.1 mg/dL (ref 0.3–1.2)
Total Protein: 6.9 g/dL (ref 6.5–8.1)

## 2020-08-19 LAB — CBC WITH DIFFERENTIAL/PLATELET
Abs Immature Granulocytes: 0.06 10*3/uL (ref 0.00–0.07)
Basophils Absolute: 0 10*3/uL (ref 0.0–0.1)
Basophils Relative: 0 %
Eosinophils Absolute: 0.2 10*3/uL (ref 0.0–0.5)
Eosinophils Relative: 2 %
HCT: 34.4 % — ABNORMAL LOW (ref 36.0–46.0)
Hemoglobin: 11.5 g/dL — ABNORMAL LOW (ref 12.0–15.0)
Immature Granulocytes: 1 %
Lymphocytes Relative: 24 %
Lymphs Abs: 1.5 10*3/uL (ref 0.7–4.0)
MCH: 28.6 pg (ref 26.0–34.0)
MCHC: 33.4 g/dL (ref 30.0–36.0)
MCV: 85.6 fL (ref 80.0–100.0)
Monocytes Absolute: 0.7 10*3/uL (ref 0.1–1.0)
Monocytes Relative: 11 %
Neutro Abs: 3.9 10*3/uL (ref 1.7–7.7)
Neutrophils Relative %: 62 %
Platelets: 226 10*3/uL (ref 150–400)
RBC: 4.02 MIL/uL (ref 3.87–5.11)
RDW: 12.8 % (ref 11.5–15.5)
WBC: 6.3 10*3/uL (ref 4.0–10.5)
nRBC: 0 % (ref 0.0–0.2)

## 2020-08-19 LAB — RESP PANEL BY RT-PCR (FLU A&B, COVID) ARPGX2
Influenza A by PCR: NEGATIVE
Influenza B by PCR: NEGATIVE
SARS Coronavirus 2 by RT PCR: NEGATIVE

## 2020-08-19 LAB — APTT: aPTT: 30 seconds (ref 24–36)

## 2020-08-19 LAB — PROTIME-INR
INR: 1.1 (ref 0.8–1.2)
Prothrombin Time: 14.1 seconds (ref 11.4–15.2)

## 2020-08-19 MED ORDER — HYDROMORPHONE HCL 1 MG/ML IJ SOLN
1.0000 mg | Freq: Once | INTRAMUSCULAR | Status: AC
  Administered 2020-08-19: 1 mg via INTRAVENOUS
  Filled 2020-08-19: qty 1

## 2020-08-19 MED ORDER — SODIUM CHLORIDE 0.9 % IV SOLN
2.0000 g | Freq: Once | INTRAVENOUS | Status: AC
  Administered 2020-08-19: 2 g via INTRAVENOUS
  Filled 2020-08-19: qty 2

## 2020-08-19 MED ORDER — VANCOMYCIN HCL 2000 MG/400ML IV SOLN
2000.0000 mg | Freq: Once | INTRAVENOUS | Status: AC
  Administered 2020-08-19: 2000 mg via INTRAVENOUS
  Filled 2020-08-19: qty 400

## 2020-08-19 MED ORDER — VANCOMYCIN HCL IN DEXTROSE 1-5 GM/200ML-% IV SOLN: 1000.0000 mg | Freq: Once | INTRAVENOUS | Status: DC

## 2020-08-19 MED ORDER — SODIUM CHLORIDE 0.9 % IV BOLUS
1000.0000 mL | Freq: Once | INTRAVENOUS | Status: AC
  Administered 2020-08-19: 1000 mL via INTRAVENOUS

## 2020-08-19 MED ORDER — METRONIDAZOLE IN NACL 5-0.79 MG/ML-% IV SOLN
500.0000 mg | Freq: Once | INTRAVENOUS | Status: AC
  Administered 2020-08-19: 500 mg via INTRAVENOUS
  Filled 2020-08-19: qty 100

## 2020-08-19 MED ORDER — LACTATED RINGERS IV SOLN: INTRAVENOUS | Status: AC

## 2020-08-19 MED ORDER — ONDANSETRON HCL 4 MG/2ML IJ SOLN
4.0000 mg | Freq: Once | INTRAMUSCULAR | Status: AC
  Administered 2020-08-19: 4 mg via INTRAVENOUS
  Filled 2020-08-19: qty 2

## 2020-08-19 NOTE — ED Notes (Signed)
2nd set of blood cultures from [facility] - collected by this nurse using sterile technique and Kurin Device

## 2020-08-19 NOTE — H&P (Signed)
History and Physical    Marie Booker:[id number] DOB: [birth year] DOA: 08/19/2020  PCP: Lenard Simmer, MD  Patient coming from: Home via EMS  I have personally briefly reviewed patient's old medical records in [place]  Chief Complaint: Left leg pain  HPI: Marie Booker is a 55 y.o. 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 pylori, anxiety, s/p recent left total knee arthroplasty who presents to the ED for evaluation of worsening left leg pain.  Patient states she had left knee replacement about 1 week ago by Dr. Harlow Mares with orthopedics.  She had been doing well postop other than constipation.  She had been taking Senokot and finally had a bowel movement.  Afterwards she developed watery diarrhea and had an episode of nausea and vomiting.  She then had a fever up to 102 F when she checked her temperature at home.  She had been noticing some erythema at the medial part of her left knee.  She was seen by orthopedic team day prior to ED arrival at which time lower extremity ultrasound was negative for DVT.  She has now seen the erythema tracking proximally up her medial left thigh.  Due to fever and concern for infection she came to the ED for further evaluation and management.  ED Course:  Initial vitals showed BP 129/64, pulse 78, RR 20, temp 99.2 F, SPO2 100% on room air.  Labs show WBC 6.3, hemoglobin 11.5, platelets 226,000, sodium 134, potassium 3.8, bicarb 21, BUN 18, creatinine 0.96, serum glucose 101, AST 49, ALT 35, alk phos 80, total bilirubin 1.1, lactic acid 1.4.  Blood cultures obtained and pending.  SARS-CoV-2 PCR negative.  Influenza A/B PCR is negative.  Portable chest x-ray negative for focal consolidation, edema, effusion.  CT of the left femur without contrast shows status post left knee replacement, large joint effusion, mild cellulitic changes  along the anterior and lateral aspects of the left knee without evidence of associated fluid collection or abscess.  Left lower extremity ultrasound is negative for evidence of DVT.  Patient was given IV vancomycin, cefepime, Flagyl, 1 L normal saline, IV Dilaudid.  The hospitalist service was consulted to admit for further evaluation and management.  Review of Systems: All systems reviewed and are negative except as documented in history of present illness above.   Past Medical History:  Diagnosis Date  . Anemia    iron infusion 4 years ago due to heavy periods  . Anxiety   . Arthritis   . Asthma   . Family history of adverse reaction to anesthesia    mom-hard time waking up  . Hypothyroidism   . Pre-diabetes   . RLS (restless legs syndrome)   . Sleep apnea   . Thyroid disease     Past Surgical History:  Procedure Laterality Date  . ABDOMINAL HYSTERECTOMY    . APPENDECTOMY    . BACK SURGERY     lower  . CARPAL TUNNEL RELEASE Left 11/07/2017   Procedure: CARPAL TUNNEL RELEASE;  Surgeon: Earnestine Leys, MD;  Location: [facility] ORS;  Service: Orthopedics;  Laterality: Left;  . CYSTOCELE REPAIR N/A 02/17/2019   Procedure: ANTERIOR REPAIR (CYSTOCELE);  Surgeon: [name], MD;  Location: [facility] ORS;  Service: Gynecology;  Laterality: N/A;  . CYSTOSCOPY N/A 02/17/2019   Procedure: CYSTOSCOPY;  Surgeon: [name], MD;  Location: [facility] ORS;  Service: Gynecology;  Laterality: N/A;  .  ENDOMETRIAL ABLATION    . LAPAROSCOPIC VAGINAL HYSTERECTOMY WITH SALPINGO OOPHORECTOMY Bilateral 02/17/2019   Procedure: LAPAROSCOPIC ASSISTED VAGINAL HYSTERECTOMY WITH BILATERAL SALPINGO OOPHORECTOMY;  Surgeon: [name], MD;  Location: [facility] ORS;  Service: Gynecology;  Laterality: Bilateral;  . LEG SURGERY    . PUBOVAGINAL SLING N/A 02/17/2019   Procedure: Gaynelle Arabian;  Surgeon: [name], MD;  Location: [facility] ORS;  Service: Gynecology;  Laterality: N/A;    Social History:  reports that  she has never smoked. She has never used smokeless tobacco. She reports that she does not drink alcohol and does not use drugs.  Allergies  Allergen Reactions  . Pregabalin     Dizziness     Family History  Problem Relation Age of Onset  . COPD Mother      Prior to Admission medications   Medication Sig Start Date End Date Taking? Authorizing Provider  ALPRAZolam Duanne Moron) 0.5 MG tablet TAKE ONE TABLET EVERY DAY AS NEEDED FOR ANXIETY 04/04/19   [name], MD  amitriptyline (ELAVIL) 25 MG tablet Take 1 tablet (25 mg total) by mouth at bedtime. 09/16/19   [name], MD  buPROPion (WELLBUTRIN XL) 150 MG 24 hr tablet Take 1 tablet (150 mg total) by mouth daily. Patient not taking: Reported on 09/16/2019 04/01/19   [name], MD  cyanocobalamin (,VITAMIN B-12,) 1000 MCG/ML injection Inject 1 mL (1,000 mcg total) into the muscle every 30 (thirty) days. 08/13/18   Shambley, Melody N, CNM  HYDROcodone-acetaminophen (NORCO) 5-325 MG tablet Take 1-2 tablets by mouth every 6 (six) hours as needed. 02/19/19   [name], MD  meloxicam (MOBIC) 15 MG tablet Take 15 mg by mouth daily as needed for pain.    [provider]  nystatin cream (MYCOSTATIN) Apply topically 2 (two) times daily. 03/12/19   [name], MD  phentermine 37.5 MG capsule Take 1 capsule (37.5 mg total) by mouth every morning. Patient not taking: Reported on 09/16/2019 04/01/19   [name], MD  rOPINIRole (REQUIP) 2 MG tablet Take 2 mg by mouth at bedtime.    [provider]  sertraline (ZOLOFT) 100 MG tablet Take 100 mg by mouth at bedtime.     [provider]  thyroid (ARMOUR) 120 MG tablet Take 180 mg by mouth daily before breakfast.     [provider]  Vitamin D, Ergocalciferol, (DRISDOL) 50000 units CAPS capsule Take 50,000 Units by mouth 2 (two) times a week.    [provider]    Physical Exam: Vitals:   08/19/20 2052 08/19/20 2130 08/19/20 2322 08/20/20 0052  BP:    (!) 145/73 139/82  Pulse:  74 85 90  Resp:  '17 20 16  ' Temp:    98.7 F (37.1 C)  TempSrc:    Oral  SpO2:  95% 96% 97%  Weight: 119 kg     Height: '5\' 6"'  (1.676 m)      Constitutional: Obese woman resting supine in bed, NAD, calm, appears tired comfortable Eyes: PERRL, lids and conjunctivae normal ENMT: Mucous membranes are dry. Posterior pharynx clear of any exudate or lesions.Normal dentition.  Neck: normal, supple, no masses. Respiratory: clear to auscultation bilaterally, no wheezing, no crackles. Normal respiratory effort. No accessory muscle use.  Cardiovascular: Regular rate and rhythm, no murmurs / rubs / gallops. No extremity edema. 2+ pedal pulses. Abdomen: no tenderness, no masses palpated.  Bowel sounds positive.  Musculoskeletal: no clubbing / cyanosis. No joint deformity upper and lower extremities. ROM slightly decreased in  the left knee., no contractures. Normal muscle tone.  Skin: Left knee surgical site with staples in place, appears to be healing well without discharge.  Areas of erythema medial left knee tracking approximately up the medial thigh. Neurologic: CN 2-12 grossly intact. Sensation intact. Strength 5/5 in all 4.  Psychiatric: Normal judgment and insight. Alert and oriented x 3. Normal mood.    Labs on Admission: I have personally reviewed following labs and imaging studies  CBC: Recent Labs  Lab 08/19/20 2056  WBC 6.3  NEUTROABS 3.9  HGB 11.5*  HCT 34.4*  MCV 85.6  PLT 941   Basic Metabolic Panel: Recent Labs  Lab 08/19/20 2056  NA 134*  K 3.8  CL 103  CO2 21*  GLUCOSE 101*  BUN 18  CREATININE 0.96  CALCIUM 8.8*   GFR: Estimated Creatinine Clearance: 85.9 mL/min (by C-G formula based on SCr of 0.96 mg/dL). Liver Function Tests: Recent Labs  Lab 08/19/20 2056  AST 49*  ALT 35  ALKPHOS 80  BILITOT 1.1  PROT 6.9  ALBUMIN 3.5   No results for input(s): LIPASE, AMYLASE in the last 168 hours. No results for input(s): AMMONIA in  the last 168 hours. Coagulation Profile: Recent Labs  Lab 08/19/20 2056  INR 1.1   Cardiac Enzymes: No results for input(s): CKTOTAL, CKMB, CKMBINDEX, TROPONINI in the last 168 hours. BNP (last 3 results) No results for input(s): PROBNP in the last 8760 hours. HbA1C: No results for input(s): HGBA1C in the last 72 hours. CBG: No results for input(s): GLUCAP in the last 168 hours. Lipid Profile: No results for input(s): CHOL, HDL, LDLCALC, TRIG, CHOLHDL, LDLDIRECT in the last 72 hours. Thyroid Function Tests: No results for input(s): TSH, T4TOTAL, FREET4, T3FREE, THYROIDAB in the last 72 hours. Anemia Panel: No results for input(s): VITAMINB12, FOLATE, FERRITIN, TIBC, IRON, RETICCTPCT in the last 72 hours. Urine analysis:    Component Value Date/Time   COLORURINE YELLOW (A) 09/28/2019 0952   APPEARANCEUR HAZY (A) 09/28/2019 0952   APPEARANCEUR Cloudy 09/19/2014 2224   LABSPEC 1.015 09/28/2019 0952   LABSPEC 1.030 09/19/2014 2224   PHURINE 5.0 09/28/2019 0952   GLUCOSEU NEGATIVE 09/28/2019 0952   GLUCOSEU Negative 09/19/2014 2224   HGBUR NEGATIVE 09/28/2019 0952   BILIRUBINUR NEGATIVE 09/28/2019 0952   BILIRUBINUR neg 09/16/2019 1121   BILIRUBINUR Negative 09/19/2014 2224   KETONESUR NEGATIVE 09/28/2019 0952   PROTEINUR NEGATIVE 09/28/2019 0952   UROBILINOGEN 0.2 09/16/2019 1121   NITRITE NEGATIVE 09/28/2019 0952   LEUKOCYTESUR NEGATIVE 09/28/2019 0952   LEUKOCYTESUR Negative 09/19/2014 2224    Radiological Exams on Admission: CT FEMUR LEFT WO CONTRAST  Result Date: 08/19/2020 CLINICAL DATA:  Left leg pain and swelling, 7 days status post left knee surgery. EXAM: CT OF THE LOWER LEFT EXTREMITY WITHOUT CONTRAST TECHNIQUE: Multidetector CT imaging of the lower left extremity was performed according to the standard protocol. COMPARISON:  None. FINDINGS: Bones/Joint/Cartilage A left knee replacement is seen with an extensive amount of associated streak artifact. Subsequently  limited evaluation of the adjacent osseous and soft tissue structures is noted. There is no evidence of an acute fracture or dislocation. A large joint effusion is seen. Ligaments Suboptimally assessed by CT. Muscles and Tendons A small amount of para muscular soft tissue air is seen along the anteromedial aspect of the distal right thigh. Soft tissues Mild subcutaneous and para muscular inflammatory fat stranding is seen along the anterior and lateral aspects of the left knee. Multiple anterior skin staples are  present. There is no evidence of associated fluid collection or abscess. IMPRESSION: 1. Status post left knee replacement. 2. Large joint effusion. 3. Mild cellulitis along the anterior and lateral aspects of the left knee without evidence of an associated fluid collection or abscess. Electronically Signed   By: Virgina Norfolk M.D.   On: 08/19/2020 22:13   US Venous Img Lower Unilateral Left  Result Date: 08/19/2020 CLINICAL DATA:  Left lower extremity pain EXAM: Left  LOWER EXTREMITY VENOUS DOPPLER ULTRASOUND TECHNIQUE: Gray-scale sonography with compression, as well as color and duplex ultrasound, were performed to evaluate the deep venous system(s) from the level of the common femoral vein through the popliteal and proximal calf veins. COMPARISON:  None. FINDINGS: VENOUS Normal compressibility of the common femoral, superficial femoral, and popliteal veins, as well as the visualized calf veins. Visualized portions of profunda femoral vein and great saphenous vein unremarkable. No filling defects to suggest DVT on grayscale or color Doppler imaging. Doppler waveforms show normal direction of venous flow, normal respiratory plasticity and response to augmentation. Limited views of the contralateral common femoral vein are unremarkable. OTHER None. Limitations: none IMPRESSION: Negative. Electronically Signed   By: Prudencio Pair M.D.   On: 08/19/2020 23:08   DG Chest Port 1 View  Result Date:  08/19/2020 CLINICAL DATA:  Postop knee surgery. EXAM: PORTABLE CHEST 1 VIEW COMPARISON:  December 08, 2010 FINDINGS: The heart size and mediastinal contours are within normal limits. Both lungs are clear. The visualized skeletal structures are unremarkable. IMPRESSION: No active disease. Electronically Signed   By: Constance Holster M.D.   On: 08/19/2020 21:18    EKG: Personally reviewed. Normal sinus rhythm without acute ischemic changes.  Assessment/Plan Principal Problem:   Cellulitis of left lower extremity Active Problems:   Anxiety   Hypothyroidism   Morbid obesity with BMI of 40.0-44.9, adult (HCC)   Enora Trillo Beeney is a 55 y.o. 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 pylori, anxiety, s/p recent left total knee arthroplasty who is admitted with cellulitis of left lower extremity.  Cellulitis of left lower extremity Large left knee joint effusion s/p left knee total arthroplasty: Patient reports undergoing left knee replacement approximately 1 week prior to admission.  She has new erythema medial left knee tracking proximally with reported fever at home.  Surgical wound site without any drainage.  Large effusion noted on CT imaging.  LLE ultrasound negative for DVT.  Does not meet sepsis criteria at time of admission. -Continue empiric IV vancomycin and ceftriaxone -Follow blood cultures -Continue pain control with Tylenol, home Norco, and as IV Dilaudid as needed for severe pain with hold parameters -Consider orthopedics consult to Dr. Harlow Mares in the morning  Hypothyroidism: Continue home thyroid supplement.  Anxiety: Continue home Zoloft, Elavil, and as needed Xanax.  DVT prophylaxis: Subcutaneous heparin Code Status: Full code, confirmed with patient Family Communication: Discussed with patient, she has discussed with family Disposition Plan: From home and likely  discharge to home Consults called: None Level of care: Med-Surg Admission status:  Status is: Observation  The patient remains OBS appropriate and will d/c before 2 midnights.  Dispo: The patient is from: Home              Anticipated d/c is to: Home              Patient currently is not medically stable to d/c.  Zada Finders MD  [facility]  If 7PM-7AM, please contact night-coverage [email or web address]  08/20/2020, 12:57 AM

## 2020-08-19 NOTE — ED Notes (Signed)
Patient transported to Ultrasound 

## 2020-08-19 NOTE — Sepsis Progress Note (Signed)
Following for sepsis monitoring ?

## 2020-08-19 NOTE — Consult Note (Signed)
CODE SEPSIS - PHARMACY COMMUNICATION  **Broad Spectrum Antibiotics should be administered within 1 hour of Sepsis diagnosis**  Time Code Sepsis Called/Page Received: 2056  Antibiotics Ordered: 2100  Time of 1st antibiotic administration: 2108  Additional action taken by pharmacy: N/A  If necessary, Name of Provider/Nurse Contacted: N/A  Martyn Malay ,PharmD Clinical Pharmacist  08/19/2020  9:24 PM

## 2020-08-19 NOTE — ED Notes (Signed)
1st set of blood cultures collected by Lillia Abed, RN - [facility]

## 2020-08-19 NOTE — Consult Note (Signed)
PHARMACY -  BRIEF ANTIBIOTIC NOTE   Pharmacy has received consult(s) for Vancomycin & Cefepime from an ED provider.  The patient's profile has been reviewed for ht/wt/allergies/indication/available labs.    One time order(s) placed for : Vancomycin 2g x1 Cefepime 2g x1 Metronidazole 500mg  IV x1  Further antibiotics/pharmacy consults should be ordered by admitting physician if indicated.                       Thank you, 08/19/2020  9:26 PM

## 2020-08-19 NOTE — ED Notes (Signed)
Patient transported to CT 

## 2020-08-19 NOTE — ED Provider Notes (Signed)
[facility] Emergency Department Provider Note  ____________________________________________  Time seen: Approximately 10:07 PM  I have reviewed the triage vital signs and the nursing notes.   HISTORY  Chief Complaint Leg Pain and Knee Pain    HPI Marie Booker is a 55 y.o. female with a history of anemia, anxiety, restless leg syndrome who had left knee replacement 7 days ago who comes ED complaining of severe left knee pain, 10/10, constant, radiating to the left thigh, worse with movement, associated fever of 102 at home for which she took Tylenol.  Also complains of nausea and vomiting.  No chest pain or shortness of breath.      Past Medical History:  Diagnosis Date  . Anemia    iron infusion 4 years ago due to heavy periods  . Anxiety   . Arthritis   . Asthma   . Family history of adverse reaction to anesthesia    mom-hard time waking up  . Hypothyroidism   . Pre-diabetes   . RLS (restless legs syndrome)   . Sleep apnea   . Thyroid disease      Patient Active Problem List   Diagnosis Date Noted  . Status post laparoscopic hysterectomy 02/17/2019  . History of uterine fibroid 09/24/2017  . Hypothyroidism 09/24/2017  . Obesity 09/24/2017  . OSA (obstructive sleep apnea) 09/24/2017  . Restless leg syndrome 09/24/2017  . Carpal tunnel syndrome of left wrist 06/28/2017  . Osteoarthritis of carpometacarpal (CMC) joint of thumb 04/27/2017  . Anxiety 04/14/2016  . Bronchitis 06/10/2015  . Major depressive disorder 03/22/2015  . Iron deficiency anemia, unspecified 11/10/2014     Past Surgical History:  Procedure Laterality Date  . ABDOMINAL HYSTERECTOMY    . APPENDECTOMY    . BACK SURGERY     lower  . CARPAL TUNNEL RELEASE Left 11/07/2017   Procedure: CARPAL TUNNEL RELEASE;  Surgeon: Deeann Saint, MD;  Location: [facility] OR[facility];  Service: Orthopedics;  Laterality: Left;  . CYSTOCELE REPAIR N/A 02/17/2019   Procedure: ANTERIOR REPAIR  (CYSTOCELE);  Surgeon: Hildred Laser, MD;  Location: [facility];  Service: Gynecology;  Laterality: N/A;  . CYSTOSCOPY N/A 02/17/2019   Procedure: CYSTOSCOPY;  Surgeon: Hildred Laser, MD;  Location: [facility];  Service: Gynecology;  Laterality: N/A;  . ENDOMETRIAL ABLATION    . LAPAROSCOPIC VAGINAL HYSTERECTOMY WITH SALPINGO OOPHORECTOMY Bilateral 02/17/2019   Procedure: LAPAROSCOPIC ASSISTED VAGINAL HYSTERECTOMY WITH BILATERAL SALPINGO OOPHORECTOMY;  Surgeon: Hildred Laser, MD;  Location: [facility];  Service: Gynecology;  Laterality: Bilateral;  . LEG SURGERY    . PUBOVAGINAL SLING N/A 02/17/2019   Procedure: Leonides Grills;  Surgeon: Hildred Laser, MD;  Location: [facility];  Service: Gynecology;  Laterality: N/A;     Prior to Admission medications   Medication Sig Start Date End Date Taking? Authorizing Provider  ALPRAZolam Prudy Feeler) 0.5 MG tablet TAKE ONE TABLET EVERY DAY AS NEEDED FOR ANXIETY 04/04/19   Hildred Laser, MD  amitriptyline (ELAVIL) 25 MG tablet Take 1 tablet (25 mg total) by mouth at bedtime. 09/16/19   Hildred Laser, MD  buPROPion (WELLBUTRIN XL) 150 MG 24 hr tablet Take 1 tablet (150 mg total) by mouth daily. Patient not taking: Reported on 09/16/2019 04/01/19   Hildred Laser, MD  cyanocobalamin (,VITAMIN B-12,) 1000 MCG/ML injection Inject 1 mL (1,000 mcg total) into the muscle every 30 (thirty) days. 08/13/18   Shambley, Melody N, CNM  HYDROcodone-acetaminophen (NORCO) 5-325 MG tablet Take 1-2 tablets by mouth every 6 (six) hours as  needed. 02/19/19   Hildred Laserherry, Anika, MD  meloxicam (MOBIC) 15 MG tablet Take 15 mg by mouth daily as needed for pain.    [provider]  nystatin cream (MYCOSTATIN) Apply topically 2 (two) times daily. 03/12/19   Hildred Laserherry, Anika, MD  phentermine 37.5 MG capsule Take 1 capsule (37.5 mg total) by mouth every morning. Patient not taking: Reported on 09/16/2019 04/01/19   Hildred Laserherry, Anika, MD  rOPINIRole (REQUIP) 2 MG tablet Take 2 mg by mouth at bedtime.     [provider]  sertraline (ZOLOFT) 100 MG tablet Take 100 mg by mouth at bedtime.     [provider]  thyroid (ARMOUR) 120 MG tablet Take 180 mg by mouth daily before breakfast.     [provider]  Vitamin D, Ergocalciferol, (DRISDOL) 50000 units CAPS capsule Take 50,000 Units by mouth 2 (two) times a week.    [provider]     Allergies Pregabalin   Family History  Problem Relation Age of Onset  . COPD Mother     Social History Social History   Tobacco Use  . Smoking status: Never Smoker  . Smokeless tobacco: Never Used  Vaping Use  . Vaping Use: Never used  Substance Use Topics  . Alcohol use: No  . Drug use: No    Review of Systems  Constitutional: Positive fever.  ENT:   No sore throat. No rhinorrhea. Cardiovascular:   No chest pain or syncope. Respiratory:   No dyspnea or cough. Gastrointestinal:   Negative for abdominal pain, vomiting and diarrhea.  Musculoskeletal: Positive left leg pain as above All other systems reviewed and are negative except as documented above in ROS and HPI.  ____________________________________________   PHYSICAL EXAM:  VITAL SIGNS: ED Triage Vitals  Enc Vitals Group     BP 08/19/20 2050 129/64     Pulse Rate 08/19/20 2050 83     Resp 08/19/20 2050 20     Temp 08/19/20 2050 99.2 F (37.3 C)     Temp Source 08/19/20 2050 Oral     SpO2 08/19/20 2050 100 %     Weight 08/19/20 2052 262 lb 5.6 oz (119 kg)     Height 08/19/20 2052 5\' 6"  (1.676 m)     Head Circumference --      Peak Flow --      Pain Score 08/19/20 2052 10     Pain Loc --      Pain Edu? --      Excl. in GC? --     Vital signs reviewed, nursing assessments reviewed.   Constitutional:   Alert and oriented. Non-toxic appearance. Eyes:   Conjunctivae are normal. EOMI. PERRL. ENT      Head:   Normocephalic and atraumatic.      Nose:   Wearing a mask.      Mouth/Throat:   Wearing a mask.      Neck:   No meningismus.  Full ROM. Hematological/Lymphatic/Immunilogical:   No cervical lymphadenopathy. Cardiovascular:   RRR. Symmetric bilateral radial and DP pulses.  No murmurs. Cap refill less than 2 seconds. Respiratory:   Normal respiratory effort without tachypnea/retractions. Breath sounds are clear and equal bilaterally. No wheezes/rales/rhonchi. Gastrointestinal:   Soft and nontender. Non distended. There is no CVA tenderness.  No rebound, rigidity, or guarding. Musculoskeletal:   Normal range of motion in all extremities.  Left knee incision is healing well, no discharge.  No joint line tenderness at the knee.  There  is purplish discoloration proximally of the left medial thigh.  No crepitus.  There is warmth and tenderness of the area.  Calf circumference on the left is increased. Neurologic:   Normal speech and language.  Motor grossly intact. No acute focal neurologic deficits are appreciated.  Skin:    Skin is warm, dry and intact. No rash noted.  No petechiae, purpura, or bullae.  ____________________________________________    LABS (pertinent positives/negatives) (all labs ordered are listed, but only abnormal results are displayed) Labs Reviewed  COMPREHENSIVE METABOLIC PANEL - Abnormal; Notable for the following components:      Result Value   Sodium 134 (*)    CO2 21 (*)    Glucose, Bld 101 (*)    Calcium 8.8 (*)    AST 49 (*)    All other components within normal limits  CBC WITH DIFFERENTIAL/PLATELET - Abnormal; Notable for the following components:   Hemoglobin 11.5 (*)    HCT 34.4 (*)    All other components within normal limits  RESP PANEL BY RT-PCR (FLU A&B, COVID) ARPGX2  CULTURE, BLOOD (ROUTINE X 2)  CULTURE, BLOOD (ROUTINE X 2)  URINE CULTURE  LACTIC ACID, PLASMA  PROTIME-INR  APTT  LACTIC ACID, PLASMA  URINALYSIS, COMPLETE (UACMP) WITH MICROSCOPIC   ____________________________________________   EKG  Interpreted by me  Date: 08/19/2020  Rate: 82  Rhythm: normal  sinus rhythm  QRS Axis: normal  Intervals: normal  ST/T Wave abnormalities: normal  Conduction Disutrbances: none  Narrative Interpretation: unremarkable      ____________________________________________    RADIOLOGY  CT FEMUR LEFT WO CONTRAST  Result Date: 08/19/2020 CLINICAL DATA:  Left leg pain and swelling, 7 days status post left knee surgery. EXAM: CT OF THE LOWER LEFT EXTREMITY WITHOUT CONTRAST TECHNIQUE: Multidetector CT imaging of the lower left extremity was performed according to the standard protocol. COMPARISON:  None. FINDINGS: Bones/Joint/Cartilage A left knee replacement is seen with an extensive amount of associated streak artifact. Subsequently limited evaluation of the adjacent osseous and soft tissue structures is noted. There is no evidence of an acute fracture or dislocation. A large joint effusion is seen. Ligaments Suboptimally assessed by CT. Muscles and Tendons A small amount of para muscular soft tissue air is seen along the anteromedial aspect of the distal right thigh. Soft tissues Mild subcutaneous and para muscular inflammatory fat stranding is seen along the anterior and lateral aspects of the left knee. Multiple anterior skin staples are present. There is no evidence of associated fluid collection or abscess. IMPRESSION: 1. Status post left knee replacement. 2. Large joint effusion. 3. Mild cellulitis along the anterior and lateral aspects of the left knee without evidence of an associated fluid collection or abscess. Electronically Signed   By: Aram Candela M.D.   On: 08/19/2020 22:13   US Venous Img Lower Unilateral Left  Result Date: 08/19/2020 CLINICAL DATA:  Left lower extremity pain EXAM: Left  LOWER EXTREMITY VENOUS DOPPLER ULTRASOUND TECHNIQUE: Gray-scale sonography with compression, as well as color and duplex ultrasound, were performed to evaluate the deep venous system(s) from the level of the common femoral vein through the popliteal and proximal  calf veins. COMPARISON:  None. FINDINGS: VENOUS Normal compressibility of the common femoral, superficial femoral, and popliteal veins, as well as the visualized calf veins. Visualized portions of profunda femoral vein and great saphenous vein unremarkable. No filling defects to suggest DVT on grayscale or color Doppler imaging. Doppler waveforms show normal direction of venous flow, normal respiratory  plasticity and response to augmentation. Limited views of the contralateral common femoral vein are unremarkable. OTHER None. Limitations: none IMPRESSION: Negative. Electronically Signed   By: Jonna Clark M.D.   On: 08/19/2020 23:08   DG Chest Port 1 View  Result Date: 08/19/2020 CLINICAL DATA:  Postop knee surgery. EXAM: PORTABLE CHEST 1 VIEW COMPARISON:  December 08, 2010 FINDINGS: The heart size and mediastinal contours are within normal limits. Both lungs are clear. The visualized skeletal structures are unremarkable. IMPRESSION: No active disease. Electronically Signed   By: Katherine Mantle M.D.   On: 08/19/2020 21:18    ____________________________________________   PROCEDURES Procedures  ____________________________________________    CLINICAL IMPRESSION / ASSESSMENT AND PLAN / ED COURSE  Medications ordered in the ED: Medications  lactated ringers infusion ( Intravenous New Bag/Given 08/19/20 2207)  vancomycin (VANCOREADY) IVPB 2000 mg/400 mL (2,000 mg Intravenous New Bag/Given 08/19/20 2210)  sodium chloride 0.9 % bolus 1,000 mL (1,000 mLs Intravenous New Bag/Given 08/19/20 2105)  ondansetron (ZOFRAN) injection 4 mg (4 mg Intravenous Given 08/19/20 2102)  HYDROmorphone (DILAUDID) injection 1 mg (1 mg Intravenous Given 08/19/20 2103)  ceFEPIme (MAXIPIME) 2 g in sodium chloride 0.9 % 100 mL IVPB (0 g Intravenous Stopped 08/19/20 2200)  metroNIDAZOLE (FLAGYL) IVPB 500 mg (0 mg Intravenous Stopped 08/19/20 2315)  HYDROmorphone (DILAUDID) injection 1 mg (1 mg Intravenous Given 08/19/20 2123)     Pertinent labs & imaging results that were available during my care of the patient were reviewed by me and considered in my medical decision making (see chart for details).  Marie Booker was evaluated in Emergency Department on 08/19/2020 for the symptoms described in the history of present illness. She was evaluated in the context of the global COVID-19 pandemic, which necessitated consideration that the patient might be at risk for infection with the SARS-CoV-2 virus that causes COVID-19. Institutional protocols and algorithms that pertain to the evaluation of patients at risk for COVID-19 are in a state of rapid change based on information released by regulatory bodies including the CDC and federal and state organizations. These policies and algorithms were followed during the patient's care in the ED.     Clinical Course as of 08/19/20 2325  Thu Aug 19, 2020  2133 Patient presents with severe left leg pain with purplish rash on the medial thigh, warmth, leg swelling and increased calf circumference.  Differential includes cellulitis, septic arthritis, osteomyelitis, necrotizing fasciitis, DVT with phlegmasia.  Will give patient IV fluids, IV Dilaudid 1 mg, IV Zofran, cefepime and Flagyl and vancomycin, obtain stat CT of the left thigh. [PS]    Clinical Course User Index [PS] Sharman Cheek, MD    ----------------------------------------- 11:24 PM on 08/19/2020 -----------------------------------------  CT shows some cellulitis in the region of the knee.  I doubt septic arthritis.  No evidence of osteomyelitis or necrotizing fasciitis.  No deep space abscess.  Ultrasound of the leg negative for DVT.  We will plan to admit for treatment of this cellulitis given fever, severe pain, recent surgery.   ____________________________________________   FINAL CLINICAL IMPRESSION(S) / ED DIAGNOSES    Final diagnoses:  Acute pain of left knee  Left leg cellulitis.   ED Discharge Orders     None      Portions of this note were generated with dragon dictation software. Dictation errors may occur despite best attempts at proofreading.   Sharman Cheek, MD 08/19/20 [phone]

## 2020-08-19 NOTE — ED Triage Notes (Signed)
Pt BIBA via [facility] c/o of leg pain. Pt is 7 days post op from knee surgery. LLE is swollen, hot to touch, and rash is developing. Pt states she had a 102.8 fever at home and took tylenol. Pt also states she has been experiencing N/V.

## 2020-08-20 ENCOUNTER — Encounter: Payer: Self-pay | Admitting: Internal Medicine

## 2020-08-20 DIAGNOSIS — L539 Erythematous condition, unspecified: Secondary | ICD-10-CM | POA: Diagnosis not present

## 2020-08-20 DIAGNOSIS — T7840XA Allergy, unspecified, initial encounter: Secondary | ICD-10-CM | POA: Diagnosis not present

## 2020-08-20 DIAGNOSIS — E039 Hypothyroidism, unspecified: Secondary | ICD-10-CM

## 2020-08-20 DIAGNOSIS — K5900 Constipation, unspecified: Secondary | ICD-10-CM | POA: Diagnosis present

## 2020-08-20 DIAGNOSIS — B379 Candidiasis, unspecified: Secondary | ICD-10-CM | POA: Diagnosis present

## 2020-08-20 DIAGNOSIS — M25562 Pain in left knee: Secondary | ICD-10-CM | POA: Diagnosis not present

## 2020-08-20 DIAGNOSIS — F32A Depression, unspecified: Secondary | ICD-10-CM | POA: Diagnosis present

## 2020-08-20 DIAGNOSIS — Z825 Family history of asthma and other chronic lower respiratory diseases: Secondary | ICD-10-CM | POA: Diagnosis not present

## 2020-08-20 DIAGNOSIS — L03116 Cellulitis of left lower limb: Secondary | ICD-10-CM | POA: Diagnosis present

## 2020-08-20 DIAGNOSIS — Z96652 Presence of left artificial knee joint: Secondary | ICD-10-CM | POA: Diagnosis present

## 2020-08-20 DIAGNOSIS — G4733 Obstructive sleep apnea (adult) (pediatric): Secondary | ICD-10-CM | POA: Diagnosis present

## 2020-08-20 DIAGNOSIS — Z20822 Contact with and (suspected) exposure to covid-19: Secondary | ICD-10-CM | POA: Diagnosis present

## 2020-08-20 DIAGNOSIS — L509 Urticaria, unspecified: Secondary | ICD-10-CM | POA: Diagnosis not present

## 2020-08-20 DIAGNOSIS — T8141XA Infection following a procedure, superficial incisional surgical site, initial encounter: Secondary | ICD-10-CM | POA: Diagnosis not present

## 2020-08-20 DIAGNOSIS — J45909 Unspecified asthma, uncomplicated: Secondary | ICD-10-CM | POA: Diagnosis present

## 2020-08-20 DIAGNOSIS — T402X5A Adverse effect of other opioids, initial encounter: Secondary | ICD-10-CM | POA: Diagnosis not present

## 2020-08-20 DIAGNOSIS — R502 Drug induced fever: Secondary | ICD-10-CM | POA: Diagnosis not present

## 2020-08-20 DIAGNOSIS — T50905A Adverse effect of unspecified drugs, medicaments and biological substances, initial encounter: Secondary | ICD-10-CM | POA: Diagnosis not present

## 2020-08-20 DIAGNOSIS — G2581 Restless legs syndrome: Secondary | ICD-10-CM | POA: Diagnosis present

## 2020-08-20 DIAGNOSIS — Z6841 Body Mass Index (BMI) 40.0 and over, adult: Secondary | ICD-10-CM

## 2020-08-20 DIAGNOSIS — F419 Anxiety disorder, unspecified: Secondary | ICD-10-CM | POA: Diagnosis present

## 2020-08-20 DIAGNOSIS — Z8619 Personal history of other infectious and parasitic diseases: Secondary | ICD-10-CM | POA: Diagnosis not present

## 2020-08-20 DIAGNOSIS — M25462 Effusion, left knee: Secondary | ICD-10-CM | POA: Diagnosis present

## 2020-08-20 DIAGNOSIS — Z79899 Other long term (current) drug therapy: Secondary | ICD-10-CM | POA: Diagnosis not present

## 2020-08-20 DIAGNOSIS — R21 Rash and other nonspecific skin eruption: Secondary | ICD-10-CM | POA: Diagnosis present

## 2020-08-20 DIAGNOSIS — Y92239 Unspecified place in hospital as the place of occurrence of the external cause: Secondary | ICD-10-CM | POA: Diagnosis not present

## 2020-08-20 DIAGNOSIS — Z888 Allergy status to other drugs, medicaments and biological substances status: Secondary | ICD-10-CM | POA: Diagnosis not present

## 2020-08-20 DIAGNOSIS — Z791 Long term (current) use of non-steroidal anti-inflammatories (NSAID): Secondary | ICD-10-CM | POA: Diagnosis not present

## 2020-08-20 DIAGNOSIS — R7303 Prediabetes: Secondary | ICD-10-CM | POA: Diagnosis present

## 2020-08-20 LAB — BASIC METABOLIC PANEL
Anion gap: 9 (ref 5–15)
BUN: 15 mg/dL (ref 6–20)
CO2: 22 mmol/L (ref 22–32)
Calcium: 8.4 mg/dL — ABNORMAL LOW (ref 8.9–10.3)
Chloride: 105 mmol/L (ref 98–111)
Creatinine, Ser: 0.84 mg/dL (ref 0.44–1.00)
GFR, Estimated: >60 mL/min (ref >60)
Glucose, Bld: 91 mg/dL (ref 70–99)
Potassium: 3.8 mmol/L (ref 3.5–5.1)
Sodium: 136 mmol/L (ref 135–145)

## 2020-08-20 LAB — LACTIC ACID, PLASMA: Lactic Acid, Venous: 1 mmol/L (ref 0.5–1.9)

## 2020-08-20 LAB — SURGICAL PCR SCREEN
MRSA, PCR: NEGATIVE
Staphylococcus aureus: NEGATIVE

## 2020-08-20 LAB — CBC
HCT: 36.6 % (ref 36.0–46.0)
Hemoglobin: 11.7 g/dL — ABNORMAL LOW (ref 12.0–15.0)
MCH: 28.3 pg (ref 26.0–34.0)
MCHC: 32 g/dL (ref 30.0–36.0)
MCV: 88.6 fL (ref 80.0–100.0)
Platelets: 193 10*3/uL (ref 150–400)
RBC: 4.13 MIL/uL (ref 3.87–5.11)
RDW: 12.9 % (ref 11.5–15.5)
WBC: 4.8 10*3/uL (ref 4.0–10.5)
nRBC: 0 % (ref 0.0–0.2)

## 2020-08-20 LAB — HIV ANTIBODY (ROUTINE TESTING W REFLEX): HIV Screen 4th Generation wRfx: NONREACTIVE

## 2020-08-20 MED ORDER — OXYCODONE HCL 5 MG PO TABS
5.0000 mg | ORAL_TABLET | Freq: Four times a day (QID) | ORAL | Status: DC | PRN
  Administered 2020-08-20: 5 mg via ORAL
  Filled 2020-08-20: qty 1

## 2020-08-20 MED ORDER — HEPARIN SODIUM (PORCINE) 5000 UNIT/ML IJ SOLN
5000.0000 [IU] | Freq: Three times a day (TID) | INTRAMUSCULAR | Status: DC
  Administered 2020-08-20 – 2020-08-22 (×7): 5000 [IU] via SUBCUTANEOUS
  Filled 2020-08-20 (×7): qty 1

## 2020-08-20 MED ORDER — ACETAMINOPHEN 650 MG RE SUPP
650.0000 mg | Freq: Four times a day (QID) | RECTAL | Status: DC | PRN
  Administered 2020-08-20: 650 mg via RECTAL
  Filled 2020-08-20: qty 1

## 2020-08-20 MED ORDER — VANCOMYCIN HCL 1750 MG/350ML IV SOLN
1750.0000 mg | INTRAVENOUS | Status: DC
  Filled 2020-08-20: qty 350

## 2020-08-20 MED ORDER — OXYCODONE HCL 5 MG PO TABS: 5.0000 mg | ORAL_TABLET | Freq: Four times a day (QID) | ORAL | Status: DC | PRN

## 2020-08-20 MED ORDER — MORPHINE SULFATE (PF) 4 MG/ML IV SOLN
4.0000 mg | INTRAVENOUS | Status: DC | PRN
  Administered 2020-08-20 – 2020-08-21 (×4): 4 mg via INTRAVENOUS
  Filled 2020-08-20 (×4): qty 1

## 2020-08-20 MED ORDER — ALPRAZOLAM 0.5 MG PO TABS
0.5000 mg | ORAL_TABLET | Freq: Every day | ORAL | Status: DC | PRN
  Administered 2020-08-20 – 2020-08-21 (×2): 0.5 mg via ORAL
  Filled 2020-08-20 (×2): qty 1

## 2020-08-20 MED ORDER — HYDROCODONE-ACETAMINOPHEN 5-325 MG PO TABS: 1.0000 | ORAL_TABLET | Freq: Four times a day (QID) | ORAL | Status: DC | PRN

## 2020-08-20 MED ORDER — DIPHENHYDRAMINE HCL 25 MG PO CAPS
25.0000 mg | ORAL_CAPSULE | Freq: Once | ORAL | Status: AC
  Administered 2020-08-20: 25 mg via ORAL
  Filled 2020-08-20: qty 1

## 2020-08-20 MED ORDER — OXYCODONE HCL 5 MG PO TABS
5.0000 mg | ORAL_TABLET | Freq: Four times a day (QID) | ORAL | Status: DC | PRN
Start: 2020-08-20 — End: 2020-08-20

## 2020-08-20 MED ORDER — HYDROMORPHONE HCL 1 MG/ML IJ SOLN
1.0000 mg | INTRAMUSCULAR | Status: DC | PRN
  Administered 2020-08-20 (×4): 1 mg via INTRAVENOUS
  Filled 2020-08-20 (×4): qty 1

## 2020-08-20 MED ORDER — CEFAZOLIN SODIUM-DEXTROSE 1-4 GM/50ML-% IV SOLN
1.0000 g | Freq: Three times a day (TID) | INTRAVENOUS | Status: DC
  Administered 2020-08-21 – 2020-08-23 (×7): 1 g via INTRAVENOUS
  Filled 2020-08-20 (×10): qty 50

## 2020-08-20 MED ORDER — IBUPROFEN 400 MG PO TABS
400.0000 mg | ORAL_TABLET | Freq: Four times a day (QID) | ORAL | Status: DC | PRN
  Administered 2020-08-20 – 2020-08-21 (×2): 400 mg via ORAL
  Filled 2020-08-20 (×2): qty 1

## 2020-08-20 MED ORDER — ROPINIROLE HCL 1 MG PO TABS
2.0000 mg | ORAL_TABLET | Freq: Every day | ORAL | Status: DC
  Administered 2020-08-20 – 2020-08-22 (×4): 2 mg via ORAL
  Filled 2020-08-20 (×4): qty 2

## 2020-08-20 MED ORDER — IBUPROFEN 400 MG PO TABS
400.0000 mg | ORAL_TABLET | Freq: Four times a day (QID) | ORAL | Status: DC | PRN
  Administered 2020-08-20: 400 mg via ORAL
  Filled 2020-08-20: qty 1

## 2020-08-20 MED ORDER — ONDANSETRON HCL 4 MG/2ML IJ SOLN
4.0000 mg | Freq: Four times a day (QID) | INTRAMUSCULAR | Status: DC | PRN
  Administered 2020-08-20: 4 mg via INTRAVENOUS
  Filled 2020-08-20: qty 2

## 2020-08-20 MED ORDER — ACETAMINOPHEN 500 MG PO TABS
1000.0000 mg | ORAL_TABLET | Freq: Four times a day (QID) | ORAL | Status: DC | PRN
  Administered 2020-08-20 (×2): 1000 mg via ORAL
  Administered 2020-08-20: 500 mg via ORAL
  Administered 2020-08-21: 1000 mg via ORAL
  Filled 2020-08-20 (×5): qty 2

## 2020-08-20 MED ORDER — AMITRIPTYLINE HCL 25 MG PO TABS
25.0000 mg | ORAL_TABLET | Freq: Every day | ORAL | Status: DC
  Administered 2020-08-20 – 2020-08-21 (×2): 25 mg via ORAL
  Filled 2020-08-20 (×3): qty 1

## 2020-08-20 MED ORDER — SERTRALINE HCL 50 MG PO TABS
100.0000 mg | ORAL_TABLET | Freq: Every day | ORAL | Status: DC
  Administered 2020-08-20 – 2020-08-22 (×4): 100 mg via ORAL
  Filled 2020-08-20 (×4): qty 2

## 2020-08-20 MED ORDER — DIPHENHYDRAMINE HCL 25 MG PO CAPS
25.0000 mg | ORAL_CAPSULE | Freq: Four times a day (QID) | ORAL | Status: DC | PRN
  Administered 2020-08-20 (×3): 25 mg via ORAL
  Filled 2020-08-20 (×3): qty 1

## 2020-08-20 MED ORDER — MORPHINE SULFATE (PF) 4 MG/ML IV SOLN: 4.0000 mg | INTRAVENOUS | Status: DC | PRN

## 2020-08-20 MED ORDER — SENNOSIDES-DOCUSATE SODIUM 8.6-50 MG PO TABS: 1.0000 | ORAL_TABLET | Freq: Every evening | ORAL | Status: DC | PRN

## 2020-08-20 MED ORDER — ONDANSETRON HCL 4 MG PO TABS: 4.0000 mg | ORAL_TABLET | Freq: Four times a day (QID) | ORAL | Status: DC | PRN

## 2020-08-20 MED ORDER — BLISTEX MEDICATED EX OINT
TOPICAL_OINTMENT | CUTANEOUS | Status: DC | PRN
  Filled 2020-08-20: qty 6.3

## 2020-08-20 MED ORDER — SODIUM CHLORIDE 0.9 % IV SOLN
2.0000 g | INTRAVENOUS | Status: DC
  Administered 2020-08-20: 2 g via INTRAVENOUS
  Filled 2020-08-20 (×2): qty 20

## 2020-08-20 MED ORDER — NYSTATIN 100000 UNIT/GM EX POWD
Freq: Two times a day (BID) | CUTANEOUS | Status: DC
  Filled 2020-08-20: qty 15

## 2020-08-20 MED ORDER — THYROID 60 MG PO TABS
180.0000 mg | ORAL_TABLET | Freq: Every day | ORAL | Status: DC
  Administered 2020-08-20 – 2020-08-23 (×4): 180 mg via ORAL
  Filled 2020-08-20 (×5): qty 3

## 2020-08-20 NOTE — Consult Note (Signed)
NAME: Marie Booker  DOB: 07-17-1965  MRN: [id number]  Date/Time: 08/20/2020 12:54 PM  REQUESTING PROVIDER: Dr.Goodrich Subjective:  REASON FOR CONSULT: left knee surgical site infection ? Marie Booker is a 55 y.o.female  Bariatric surgery  By robotic lap on 05/20/20, depression,OSA,  Lumbar sugery ( L4-L5 hemilaminectomy) for lumbar radiculopathy, with a history of recent left knee replacement by Dr.Bowers a week ago Presents with left knee pain, fever  As per patient she had surgery last week. Her pain was not well controlled in the hospital and her meds was switched to dilaudid PO. On Monday she called Dr.Bower's office because of worsening pain and was asked to take the pain meds round the clock. She had a fever yesterday and was asked to go to the ED Vitals in the ED 129/64, HR 83, RR 20, TEMP 99.2 Labs WBC 6.3/HB 11.5, PLT 226, cr 0.96, AST 49 Blood culture sent and she was started on triple antibiotic therapy with vanco/cefepime and metronidazole  Pt has been itching since admission . She is getting IV dilaudid and IV vanco She says she had a similar reaction to dilaudid 10 yrs ago and had not taken it until recently- She says PO dilaudid did not cause any problem She has some headache No cough No sob She was constipated for a few days and took laxatives and had a good bowel movt and also had 2 episodes of diarrhea which has resolved  Pt says she had lumbar fusion a few years ago and had staph aureus infection at the site- Cannot find records of it in [place] everywhere  Seen By Dr.Bowers since admission and the SSI is okay  Past Medical History:  Diagnosis Date  . Anemia    iron infusion 4 years ago due to heavy periods  . Anxiety   . Arthritis   . Asthma   . Family history of adverse reaction to anesthesia    mom-hard time waking up  . Hypothyroidism   . Pre-diabetes   . RLS (restless legs syndrome)   . Sleep apnea   . Thyroid disease     Past Surgical History:  Procedure  Laterality Date  . ABDOMINAL HYSTERECTOMY    . APPENDECTOMY    . BACK SURGERY     lower  . CARPAL TUNNEL RELEASE Left 11/07/2017   Procedure: CARPAL TUNNEL RELEASE;  Surgeon: Deeann Saint, MD;  Location: [facility] ORS;  Service: Orthopedics;  Laterality: Left;  . CYSTOCELE REPAIR N/A 02/17/2019   Procedure: ANTERIOR REPAIR (CYSTOCELE);  Surgeon: Hildred Laser, MD;  Location: [facility] ORS;  Service: Gynecology;  Laterality: N/A;  . CYSTOSCOPY N/A 02/17/2019   Procedure: CYSTOSCOPY;  Surgeon: Hildred Laser, MD;  Location: [facility] ORS;  Service: Gynecology;  Laterality: N/A;  . ENDOMETRIAL ABLATION    . LAPAROSCOPIC VAGINAL HYSTERECTOMY WITH SALPINGO OOPHORECTOMY Bilateral 02/17/2019   Procedure: LAPAROSCOPIC ASSISTED VAGINAL HYSTERECTOMY WITH BILATERAL SALPINGO OOPHORECTOMY;  Surgeon: Hildred Laser, MD;  Location: [facility] ORS;  Service: Gynecology;  Laterality: Bilateral;  . LEG SURGERY    . PUBOVAGINAL SLING N/A 02/17/2019   Procedure: Leonides Grills;  Surgeon: Hildred Laser, MD;  Location: [facility] OR[facility];  Service: Gynecology;  Laterality: N/A;    Social History   Socioeconomic History  . Marital status: Divorced    Spouse name: Not on file  . Number of children: Not on file  . Years of education: Not on file  . Highest education level: Not on file  Occupational History  . Not on file  Tobacco Use  . Smoking status: Never Smoker  . Smokeless tobacco: Never Used  Vaping Use  . Vaping Use: Never used  Substance and Sexual Activity  . Alcohol use: No  . Drug use: No  . Sexual activity: Yes    Birth control/protection: Post-menopausal  Other Topics Concern  . Not on file  Social History Narrative  . Not on file   Social Determinants of Health   Financial Resource Strain: Not on file  Food Insecurity: Not on file  Transportation Needs: Not on file  Physical Activity: Not on file  Stress: Not on file  Social Connections: Not on file  Intimate Partner Violence: Not on file    Family History   Problem Relation Age of Onset  . COPD Mother    Allergies  Allergen Reactions  . Pregabalin     Dizziness    I? Current Facility-Administered Medications  Medication Dose Route Frequency Provider Last Rate Last Admin  . acetaminophen (TYLENOL) tablet 1,000 mg  1,000 mg Oral Q6H PRN Darreld Mclean R, MD   1,000 mg at 08/20/20 1113   Or  . acetaminophen (TYLENOL) suppository 650 mg  650 mg Rectal Q6H PRN Charlsie Quest, MD   650 mg at 08/20/20 0405  . ALPRAZolam (XANAX) tablet 0.5 mg  0.5 mg Oral Daily PRN Charlsie Quest, MD      . amitriptyline (ELAVIL) tablet 25 mg  25 mg Oral QHS Patel, Vishal R, MD      . cefTRIAXone (ROCEPHIN) 2 g in sodium chloride 0.9 % 100 mL IVPB  2 g Intravenous Q24H Darreld Mclean R, MD 200 mL/hr at 08/20/20 0810 2 g at 08/20/20 0810  . diphenhydrAMINE (BENADRYL) capsule 25 mg  25 mg Oral Q6H PRN Charlsie Quest, MD   25 mg at 08/20/20 0739  . heparin injection 5,000 Units  5,000 Units Subcutaneous Q8H Charlsie Quest, MD   5,000 Units at 08/20/20 0514  . HYDROmorphone (DILAUDID) injection 1 mg  1 mg Intravenous Q3H PRN Darreld Mclean R, MD   1 mg at 08/20/20 1100  . ibuprofen (ADVIL) tablet 400 mg  400 mg Oral Q6H PRN Standley Brooking, MD   400 mg at 08/20/20 1140  . lactated ringers infusion   Intravenous Continuous Sharman Cheek, MD 150 mL/hr at 08/20/20 0454 Infusion Verify at 08/20/20 0454  . lip balm (BLISTEX) ointment   Topical PRN Standley Brooking, MD      . nystatin (MYCOSTATIN/NYSTOP) topical powder   Topical BID Standley Brooking, MD      . ondansetron [facility]) tablet 4 mg  4 mg Oral Q6H PRN Charlsie Quest, MD       Or  . ondansetron [facility]) injection 4 mg  4 mg Intravenous Q6H PRN Charlsie Quest, MD   4 mg at 08/20/20 0116  . oxyCODONE (Oxy IR/ROXICODONE) immediate release tablet 5 mg  5 mg Oral Q6H PRN Jimmye Norman, NP   5 mg at 08/20/20 0514  . rOPINIRole (REQUIP) tablet 2 mg  2 mg Oral QHS Darreld Mclean R, MD   2 mg at  08/20/20 0118  . senna-docusate (Senokot-S) tablet 1 tablet  1 tablet Oral QHS PRN Darreld Mclean R, MD      . sertraline (ZOLOFT) tablet 100 mg  100 mg Oral QHS Darreld Mclean R, MD   100 mg at 08/20/20 0118  . thyroid (ARMOUR) tablet 180 mg  180 mg Oral QAC breakfast Allena Katz, Eston Esters  R, MD   180 mg at 08/20/20 0802  . vancomycin (VANCOREADY) IVPB 1750 mg/350 mL  1,750 mg Intravenous Q24H Hall, Scott A, RPH         Abtx:  Anti-infectives (From admission, onward)   Start     Dose/Rate Route Frequency Ordered Stop   08/20/20 1800  vancomycin (VANCOREADY) IVPB 1750 mg/350 mL        1,750 mg 175 mL/hr over 120 Minutes Intravenous Every 24 hours 08/20/20 0348     08/20/20 0800  cefTRIAXone (ROCEPHIN) 2 g in sodium chloride 0.9 % 100 mL IVPB        2 g 200 mL/hr over 30 Minutes Intravenous Every 24 hours 08/20/20 0029     08/19/20 2130  vancomycin (VANCOREADY) IVPB 2000 mg/400 mL        2,000 mg 200 mL/hr over 120 Minutes Intravenous  Once 08/19/20 2121 08/20/20 0025   08/19/20 2100  ceFEPIme (MAXIPIME) 2 g in sodium chloride 0.9 % 100 mL IVPB        2 g 200 mL/hr over 30 Minutes Intravenous  Once 08/19/20 2054 08/19/20 2200   08/19/20 2100  metroNIDAZOLE (FLAGYL) IVPB 500 mg        500 mg 100 mL/hr over 60 Minutes Intravenous  Once 08/19/20 2054 08/19/20 2315   08/19/20 2100  vancomycin (VANCOCIN) IVPB 1000 mg/200 mL premix  Status:  Discontinued        1,000 mg 200 mL/hr over 60 Minutes Intravenous  Once 08/19/20 2054 08/19/20 2121      REVIEW OF SYSTEMS:  Const:  fever, negative chills, negative weight loss Eyes: negative diplopia or visual changes, negative eye pain ENT: negative coryza, negative sore throat Resp: negative cough, hemoptysis, dyspnea Cards: negative for chest pain, palpitations, lower extremity edema GU: negative for frequency, dysuria and hematuria GI: Negative for abdominal pain, Skin:  rash and pruritus Heme: negative for easy bruising and gum/nose bleeding MS:  left knee pain Neurolo:+ headaches, no dizziness, vertigo, memory problems  Psych: negative for feelings of anxiety, depression  Endocrine: negative for thyroid, diabetes Allergy/Immunology- dilaudid IV causes itching and hives?  Objective:  VITALS:   Patient Vitals for the past 24 hrs:  BP Temp Temp src Pulse Resp SpO2 Height Weight  08/20/20 1547 (!) 111/54 99.1 F (37.3 C) Oral 91 17 98 % - -  08/20/20 1338 - 99.5 F (37.5 C) Oral - - - - -  08/20/20 1232 - (!) 102.5 F (39.2 C) - - - - - -  08/20/20 1146 134/66 (!) 103 F (39.4 C) - (!) 102 - 97 % - -  08/20/20 0850 - 98.6 F (37 C) Oral - - - - -  08/20/20 0704 106/63 (!) 101.6 F (38.7 C) Oral (!) 109 17 94 % - -  08/20/20 0610 - (!) 101.4 F (38.6 C) Oral - - - - -  08/20/20 0519 (!) 151/56 (!) 102.5 F (39.2 C) Oral (!) 110 20 95 % - -  08/20/20 0410 - (!) 103.1 F (39.5 C) Oral - - - - -  08/20/20 0312 (!) 153/66 (!) 102.4 F (39.1 C) Oral 98 18 96 % - -  08/20/20 0052 139/82 98.7 F (37.1 C) Oral 90 16 97 % - -  08/19/20 2322 (!) 145/73 - - 85 20 96 % - -  08/19/20 2130 - - - 74 17 95 % - -  08/19/20 2052 - - - - - - 5\' 6"  (1.676 m)  119 kg  08/19/20 2050 129/64 99.2 F (37.3 C) Oral 83 20 100 % - -  PHYSICAL EXAM:  General: Alert, cooperative, in distress because of itching, and also the different machine beeping  Head: Normocephalic, without obvious abnormality, atraumatic. Eyes: Conjunctivae clear, anicteric sclerae. Pupils are equal ENT Nares normal. No drainage or sinus tenderness. Lips, mucosa, and tongue normal. No Thrush Neck: Supple, symmetrical, no adenopathy, thyroid: non tender no carotid bruit and no JVD. Back: No CVA tenderness. Lungs: Clear to auscultation bilaterally. No Wheezing or Rhonchi. No rales. Heart: Regular rate and rhythm, no murmur, rub or gallop. Abdomen: Soft, non-tender,not distended. Bowel sounds normal. No masses Extremities: left knee covered with dresisng- pictures taken By  Dr.Bowers reviewed  sugical site looks clean, no discharge mild erythema around, more at the places she has been scratching  Skin:patchy erythema all over, HIVes on her face and scratch sites are erythematous  Lymph: Cervical, supraclavicular normal. Neurologic: Grossly non-focal Pertinent Labs Lab Results CBC    Component Value Date/Time   WBC 4.8 08/20/2020 0455   RBC 4.13 08/20/2020 0455   HGB 11.7 (L) 08/20/2020 0455   HGB 11.3 (L) 09/19/2014 2224   HCT 36.6 08/20/2020 0455   HCT 36.2 09/19/2014 2224   PLT 193 08/20/2020 0455   PLT 283 09/19/2014 2224   MCV 88.6 08/20/2020 0455   MCV 80 09/19/2014 2224   MCH 28.3 08/20/2020 0455   MCHC 32.0 08/20/2020 0455   RDW 12.9 08/20/2020 0455   RDW 15.7 (H) 09/19/2014 2224   LYMPHSABS 1.5 08/19/2020 2056   MONOABS 0.7 08/19/2020 2056   EOSABS 0.2 08/19/2020 2056   BASOSABS 0.0 08/19/2020 2056    CMP Latest Ref Rng & Units 08/20/2020 08/19/2020 09/28/2019  Glucose 70 - 99 mg/dL 91 478(G) 94  BUN 6 - 20 mg/dL 15 18 20   Creatinine 0.44 - 1.00 mg/dL 9.56 2.13  Sodium 135 - 145 mmol/L 136 134(L) 138  Potassium 3.5 - 5.1 mmol/L 3.8 3.8 3.5  Chloride 98 - 111 mmol/L 105 103 107  CO2 22 - 32 mmol/L 22 21(L) 24  Calcium 8.9 - 10.3 mg/dL 0.86) 5.7(Q) 4.6(N)  Total Protein 6.5 - 8.1 g/dL - 6.9 6.2(X)  Total Bilirubin 0.3 - 1.2 mg/dL - 1.1 0.6  Alkaline Phos 38 - 126 U/L - 80 70  AST 15 - 41 U/L - 49(H) 17  ALT 0 - 44 U/L - 35 16      Microbiology: Recent Results (from the past 240 hour(s))  Resp Panel by RT-PCR (Flu A&B, Covid) Nasopharyngeal Swab     Status: None   Collection Time: 08/19/20  8:56 PM   Specimen: Nasopharyngeal Swab; Nasopharyngeal(NP) swabs in vial transport medium  Result Value Ref Range Status   SARS Coronavirus 2 by RT PCR NEGATIVE NEGATIVE Final    Comment: (NOTE) SARS-CoV-2 target nucleic acids are NOT DETECTED.  The SARS-CoV-2 RNA is generally detectable in upper respiratory specimens during the  acute phase of infection. The lowest concentration of SARS-CoV-2 viral copies this assay can detect is 138 copies/mL. A negative result does not preclude SARS-Cov-2 infection and should not be used as the sole basis for treatment or other patient management decisions. A negative result may occur with  improper specimen collection/handling, submission of specimen other than nasopharyngeal swab, presence of viral mutation(s) within the areas targeted by this assay, and inadequate number of viral copies(<138 copies/mL). A negative result must be combined with clinical observations, patient history, and epidemiological  information. The expected result is Negative.  Fact Sheet for Patients:  BloggerCourse.com  Fact Sheet for Healthcare Providers:  SeriousBroker.it  This test is no t yet approved or cleared by the [place] FDA and  has been authorized for detection and/or diagnosis of SARS-CoV-2 by FDA under an Emergency Use Authorization (EUA). This EUA will remain  in effect (meaning this test can be used) for the duration of the COVID-19 declaration under Section 564(b)(1) of the Act, 21 U.S.C.section 360bbb-3(b)(1), unless the authorization is terminated  or revoked sooner.       Influenza A by PCR NEGATIVE NEGATIVE Final   Influenza B by PCR NEGATIVE NEGATIVE Final    Comment: (NOTE) The Xpert Xpress SARS-CoV-2/FLU/RSV plus assay is intended as an aid in the diagnosis of influenza from Nasopharyngeal swab specimens and should not be used as a sole basis for treatment. Nasal washings and aspirates are unacceptable for Xpert Xpress SARS-CoV-2/FLU/RSV testing.  Fact Sheet for Patients: BloggerCourse.com  Fact Sheet for Healthcare Providers: SeriousBroker.it  This test is not yet approved or cleared by the [place] FDA and has been authorized for detection and/or  diagnosis of SARS-CoV-2 by FDA under an Emergency Use Authorization (EUA). This EUA will remain in effect (meaning this test can be used) for the duration of the COVID-19 declaration under Section 564(b)(1) of the Act, 21 U.S.C. section 360bbb-3(b)(1), unless the authorization is terminated or revoked.  Performed at [facility], [address] [place], [place] [address]   Blood Culture (routine x 2)     Status: None (Preliminary result)   Collection Time: 08/19/20  8:56 PM   Specimen: BLOOD  Result Value Ref Range Status   Specimen Description BLOOD LEFT ANTECUBITAL  Final   Special Requests   Final    BOTTLES DRAWN AEROBIC AND ANAEROBIC Blood Culture results may not be optimal due to an excessive volume of blood received in culture bottles   Culture   Final    NO GROWTH < 12 HOURS Performed at [facility], [address] [place], [place] [address]    Report Status PENDING  Incomplete  Blood Culture (routine x 2)     Status: None (Preliminary result)   Collection Time: 08/19/20  9:19 PM   Specimen: BLOOD  Result Value Ref Range Status   Specimen Description BLOOD RIGHT ANTECUBITAL  Final   Special Requests   Final    BOTTLES DRAWN AEROBIC AND ANAEROBIC Blood Culture adequate volume   Culture   Final    NO GROWTH < 12 HOURS Performed at [facility], [address], [place], [place] [address]    Report Status PENDING  Incomplete    IMAGING RESULTS: Left knee Status post left knee replacement. 2. Large joint effusion. 3. Mild cellulitis along the anterior and lateral aspects of the left knee without evidence of an associated fluid collection or abscess. I have personally reviewed the films ? Impression/Recommendation ? 56 yr female with a reent left knee replacement a week ago presents with fver, pain worsening left knee with erythema  Mild erythema  of the skin around the surgical site  ?but the surgical site itself looks good So is  this cellulitis or is this drug reaction to dilaudid as she is scratching all over  Currently no evidence to suggest a deep PJI Normal WBC   She is now on vanco/ceftraixone and metronidazole Change to cefazolin  H/o Dilaudid allergy with hives and itching in the past- she  tolerated PO dilaudid at home and now since admission has bene itching and scratching with Iv dilaudid. Has hives  H/o gastric sleeve  H/o lumbar fusion Says she had staph aureus infection following that many years ago  Discussed the management with patient , hospitalist and ortho ID will follow remotely this weekend- call if needed [facility] on call this weekend   ? ___________________________________________________ Discussed with patient, requesting provider Note:  This document was prepared using Dragon voice recognition software and may include unintentional dictation errors.

## 2020-08-20 NOTE — Progress Notes (Signed)
Pharmacy Antibiotic Note  Marie Booker is a 55 y.o. female admitted on 08/19/2020 with cellulitis.  Pharmacy has been consulted for Vancomycin dosing.  Plan: Vancomycin 1750 mg IV Q 24 hrs. Goal AUC 400-550. Expected AUC: 532 SCr used: 0.96, predicted Css min 11.9   Height: 5\' 6"  (167.6 cm) Weight: 119 kg (262 lb 5.6 oz) IBW/kg (Calculated) : 59.3  Temp (24hrs), Avg:100.1 F (37.8 C), Min:98.7 F (37.1 C), Max:102.4 F (39.1 C)  Recent Labs  Lab 08/19/20 2056 08/19/20 2344  WBC 6.3  --   CREATININE 0.96  --   LATICACIDVEN 1.4 1.0    Estimated Creatinine Clearance: 85.9 mL/min (by C-G formula based on SCr of 0.96 mg/dL).    Allergies  Allergen Reactions  . Pregabalin     Dizziness     Antimicrobials this admission:   >>    >>   Dose adjustments this admission:   Microbiology results:  BCx:   UCx:    Sputum:    MRSA PCR:   Thank you for allowing pharmacy to be a part of this patient's care.  10/19/20 A 08/20/2020 3:49 AM

## 2020-08-20 NOTE — Plan of Care (Signed)

## 2020-08-20 NOTE — Progress Notes (Addendum)
PROGRESS NOTE  Marie Booker WCH:[id number] DOB: 1965-07-25 DOA: 08/19/2020 PCP: Alan Mulder, MD  Brief History   55 year old woman status post total knee replacement at [facility] approximately 7 days ago presented with several days of fever, leg erythema and worsening left knee pain.  A & P  Left knee and leg cellulitis with fever status post total knee replacement approximately 1 week ago. Imaging showed large joint fluid effusion and mild cellulitis of the knee without complicating features. --Continue empiric antibiotics. Orthopedics consultation pending. Nonoperative management at this point. Infectious disease consultation.  Hypothyroidism --Continue levothyroxine  Anxiety --Continue Zoloft, Elavil and Xanax as needed  Morbid obesity Body mass index is 42.34 kg/m. --dietician consult  Addendum Reports itching w/ IV dilaudid but tolerated oral.  Infectious disease thinks patient may be experiencing allergic reaction to Dilaudid.  Discussed in detail with patient.  Stop Dilaudid.  Add allergy meds.  Patient reports she tolerated IV morphine many times in the past.  Adjust pain medications.  Monitor. Disposition Plan:  Discussion:   Status is: Inpatient  Remains inpatient appropriate because:IV treatments appropriate due to intensity of illness or inability to take PO and Inpatient level of care appropriate due to severity of illness   Dispo: The patient is from: Home              Anticipated d/c is to: Home              Patient currently is not medically stable to d/c.   Difficult to place patient No  DVT prophylaxis: heparin injection 5,000 Units Start: 08/20/20 0600   Code Status: Full Code Level of care: Med-Surg Family Communication: at bedside  Brendia Sacks, MD  [facility] Direct contact: see [email or web address] (further directions at bottom of note if needed) 7PM-7AM contact night coverage as at bottom of note 08/20/2020, 4:21 PM  LOS: 0 days    Significant Hospital Events   .    Consults:  . Orthopedics  . ID   Procedures:  .   Significant Diagnostic Tests:  Marland Kitchen    Micro Data:  .    Antimicrobials:  .   Interval History/Subjective  CC: f/u fever  Feels poorly, fever since Tuesday, red leg since Monday Pain in knee worsening, can't hardly move it Had serious staph infection 7 days postop back surgery years ago requiring return trip to the OR.  Objective   Vitals:  Vitals:   08/20/20 1338 08/20/20 1547  BP:  (!) 111/54  Pulse:  91  Resp:  17  Temp: 99.5 F (37.5 C) 99.1 F (37.3 C)  SpO2:  98%    Exam:  Constitutional:   . Appears anxious, ill but not toxic ENMT:  . grossly normal hearing  Respiratory:  . CTA bilaterally, no w/r/r.  . Respiratory effort normal.  Cardiovascular:  . RRR, no m/r/g . No LE extremity edema   Skin:  . Left thigh erythema, tenderness, no lesions or fluctuance. Very hot to touch. Knee bandaged. . Right thigh w/ patch of erythema . Blotching of skin on chest and arms Psychiatric:  . Mental status o Mood, affect appropriate  I have personally reviewed the following:   Today's Data  . Basic metabolic panel unremarkable . CBC unremarkable  Scheduled Meds: . amitriptyline  25 mg Oral QHS  . heparin  5,000 Units Subcutaneous Q8H  . nystatin   Topical BID  . rOPINIRole  2 mg Oral QHS  . sertraline  100  mg Oral QHS  . thyroid  180 mg Oral QAC breakfast   Continuous Infusions: . [START ON 08/21/2020]  ceFAZolin (ANCEF) IV    . lactated ringers 150 mL/hr at 08/20/20 0454    Principal Problem:   Cellulitis of left lower extremity Active Problems:   Anxiety   Hypothyroidism   Obesity, Class III, BMI 40-49.9 (morbid obesity) (HCC)   LOS: 0 days   How to contact the [facility] Attending or Consulting provider 7A - 7P or covering provider during after hours 7P -7A, for this patient?  1. Check the care team in [facility] and look for a) attending/consulting [facility] provider  listed and b) the [facility] team listed 2. Log into [email or web address] and use [place]'s universal password to access. If you do not have the password, please contact the hospital operator. 3. Locate the [facility] provider you are looking for under [facility] and page to a number that you can be directly reached. 4. If you still have difficulty reaching the provider, please page the [facility] (Director on Call) for the Hospitalists listed on amion for assistance.

## 2020-08-20 NOTE — Consult Note (Signed)
ORTHOPAEDIC CONSULTATION  REQUESTING PHYSICIAN: Standley Brooking, MD  Chief Complaint: left leg pain HPI: Marie Booker is a 55 y.o. 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 pylori, anxiety, s/p recent left total knee arthroplasty who is admitted with cellulitis of left lower extremity.  : Patient reports undergoing left knee replacement approximately 1 week prior to admission.  She has new erythema medial left knee tracking proximally with reported fever at home.  Surgical wound site without any drainage.  Large effusion noted on CT imaging.  LLE ultrasound negative for DVT.  Did not meet sepsis criteria at time of admission.     Past Medical History:  Diagnosis Date  . Anemia    iron infusion 4 years ago due to heavy periods  . Anxiety   . Arthritis   . Asthma   . Family history of adverse reaction to anesthesia    mom-hard time waking up  . Hypothyroidism   . Pre-diabetes   . RLS (restless legs syndrome)   . Sleep apnea   . Thyroid disease    Past Surgical History:  Procedure Laterality Date  . ABDOMINAL HYSTERECTOMY    . APPENDECTOMY    . BACK SURGERY     lower  . CARPAL TUNNEL RELEASE Left 11/07/2017   Procedure: CARPAL TUNNEL RELEASE;  Surgeon: Deeann Saint, MD;  Location: [facility];  Service: Orthopedics;  Laterality: Left;  . CYSTOCELE REPAIR N/A 02/17/2019   Procedure: ANTERIOR REPAIR (CYSTOCELE);  Surgeon: Hildred Laser, MD;  Location: [facility];  Service: Gynecology;  Laterality: N/A;  . CYSTOSCOPY N/A 02/17/2019   Procedure: CYSTOSCOPY;  Surgeon: Hildred Laser, MD;  Location: [facility];  Service: Gynecology;  Laterality: N/A;  . ENDOMETRIAL ABLATION    . LAPAROSCOPIC VAGINAL HYSTERECTOMY WITH SALPINGO OOPHORECTOMY Bilateral 02/17/2019   Procedure: LAPAROSCOPIC ASSISTED VAGINAL HYSTERECTOMY WITH BILATERAL SALPINGO OOPHORECTOMY;  Surgeon: Hildred Laser, MD;  Location: [facility];  Service: Gynecology;  Laterality: Bilateral;  . LEG SURGERY    . PUBOVAGINAL SLING N/A 02/17/2019   Procedure: Leonides Grills;  Surgeon: Hildred Laser, MD;  Location: [facility];  Service: Gynecology;  Laterality: N/A;   Social History   Socioeconomic History  . Marital status: Divorced    Spouse name: Not on file  . Number of children: Not on file  . Years of education: Not on file  . Highest education level: Not on file  Occupational History  . Not on file  Tobacco Use  . Smoking status: Never Smoker  . Smokeless tobacco: Never Used  Vaping Use  . Vaping Use: Never used  Substance and Sexual Activity  . Alcohol use: No  . Drug use: No  . Sexual activity: Yes    Birth control/protection: Post-menopausal  Other Topics Concern  . Not on file  Social History Narrative  . Not on file   Social Determinants of Health   Financial Resource Strain: Not on file  Food Insecurity: Not on file  Transportation Needs: Not on file  Physical Activity: Not on file  Stress: Not on file  Social Connections: Not on file   Family History  Problem Relation Age of Onset  . COPD Mother    Allergies  Allergen Reactions  . Pregabalin     Dizziness    Prior to Admission medications   Medication Sig Start Date End Date Taking? Authorizing Provider  ALPRAZolam Prudy Feeler) 0.5 MG tablet TAKE ONE  TABLET EVERY DAY AS NEEDED FOR ANXIETY 04/04/19   Hildred Laser, MD  amitriptyline (ELAVIL) 25 MG tablet Take 1 tablet (25 mg total) by mouth at bedtime. 09/16/19   Hildred Laser, MD  buPROPion (WELLBUTRIN XL) 150 MG 24 hr tablet Take 1 tablet (150 mg total) by mouth daily. Patient not taking: Reported on 09/16/2019 04/01/19   Hildred Laser, MD  cyanocobalamin (,VITAMIN B-12,) 1000 MCG/ML injection Inject 1 mL (1,000 mcg total) into the muscle every 30 (thirty) days. 08/13/18   Shambley, Melody N, CNM  HYDROcodone-acetaminophen (NORCO) 5-325 MG tablet Take 1-2 tablets by  mouth every 6 (six) hours as needed. 02/19/19   Hildred Laser, MD  meloxicam (MOBIC) 15 MG tablet Take 15 mg by mouth daily as needed for pain.    [provider]  nystatin cream (MYCOSTATIN) Apply topically 2 (two) times daily. 03/12/19   Hildred Laser, MD  phentermine 37.5 MG capsule Take 1 capsule (37.5 mg total) by mouth every morning. Patient not taking: Reported on 09/16/2019 04/01/19   Hildred Laser, MD  rOPINIRole (REQUIP) 2 MG tablet Take 2 mg by mouth at bedtime.    [provider]  sertraline (ZOLOFT) 100 MG tablet Take 100 mg by mouth at bedtime.     [provider]  thyroid (ARMOUR) 120 MG tablet Take 180 mg by mouth daily before breakfast.     [provider]  Vitamin D, Ergocalciferol, (DRISDOL) 50000 units CAPS capsule Take 50,000 Units by mouth 2 (two) times a week.    [provider]   CT FEMUR LEFT WO CONTRAST  Result Date: 08/19/2020 CLINICAL DATA:  Left leg pain and swelling, 7 days status post left knee surgery. EXAM: CT OF THE LOWER LEFT EXTREMITY WITHOUT CONTRAST TECHNIQUE: Multidetector CT imaging of the lower left extremity was performed according to the standard protocol. COMPARISON:  None. FINDINGS: Bones/Joint/Cartilage A left knee replacement is seen with an extensive amount of associated streak artifact. Subsequently limited evaluation of the adjacent osseous and soft tissue structures is noted. There is no evidence of an acute fracture or dislocation. A large joint effusion is seen. Ligaments Suboptimally assessed by CT. Muscles and Tendons A small amount of para muscular soft tissue air is seen along the anteromedial aspect of the distal right thigh. Soft tissues Mild subcutaneous and para muscular inflammatory fat stranding is seen along the anterior and lateral aspects of the left knee. Multiple anterior skin staples are present. There is no evidence of associated fluid collection or abscess. IMPRESSION: 1. Status post left knee  replacement. 2. Large joint effusion. 3. Mild cellulitis along the anterior and lateral aspects of the left knee without evidence of an associated fluid collection or abscess. Electronically Signed   By: Aram Candela M.D.   On: 08/19/2020 22:13   US Venous Img Lower Unilateral Left  Result Date: 08/19/2020 CLINICAL DATA:  Left lower extremity pain EXAM: Left  LOWER EXTREMITY VENOUS DOPPLER ULTRASOUND TECHNIQUE: Gray-scale sonography with compression, as well as color and duplex ultrasound, were performed to evaluate the deep venous system(s) from the level of the common femoral vein through the popliteal and proximal calf veins. COMPARISON:  None. FINDINGS: VENOUS Normal compressibility of the common femoral, superficial femoral, and popliteal veins, as well as the visualized calf veins. Visualized portions of profunda femoral vein and great saphenous vein unremarkable. No filling defects to suggest DVT on grayscale or color Doppler imaging. Doppler waveforms show normal direction of venous flow, normal respiratory plasticity and response  to augmentation. Limited views of the contralateral common femoral vein are unremarkable. OTHER None. Limitations: none IMPRESSION: Negative. Electronically Signed   By: Jonna Clark M.D.   On: 08/19/2020 23:08   DG Chest Port 1 View  Result Date: 08/19/2020 CLINICAL DATA:  Postop knee surgery. EXAM: PORTABLE CHEST 1 VIEW COMPARISON:  December 08, 2010 FINDINGS: The heart size and mediastinal contours are within normal limits. Both lungs are clear. The visualized skeletal structures are unremarkable. IMPRESSION: No active disease. Electronically Signed   By: Katherine Mantle M.D.   On: 08/19/2020 21:18    Positive ROS: All other systems have been reviewed and were otherwise negative with the exception of those mentioned in the HPI and as above.  PHYSICAL EXAM:   Neurologically intact ABD soft Neurovascular intact Sensation intact distally Intact pulses  distally Dorsiflexion/Plantar flexion intact Incision: dressing C/D/I No cellulitis present Compartment soft  Assessment: Cellulitis of left lower extremity Large left knee joint effusion s/p left knee total arthroplasty  Plan: -Continue empiric IV vancomycin and ceftriaxone -Follow blood cultures -Continue pain control with Tylenol, home Norco, and as IV Dilaudid as needed for severe pain with hold parameters -Dr. Odis Luster to continue to follow, dressing changed, WBAT LLE    Altamese Cabal, PA-C

## 2020-08-20 NOTE — Hospital Course (Addendum)
55 year old woman status post total knee replacement at [facility] approximately 7 days ago presented with several days of fever, leg erythema and worsening left knee pain. Treated w/ abx w/ improvement, but probably mostly drug fever and hives from Dilaudid. Improving on steroids, benadryl, home soon.  A & P  Left knee and leg cellulitis with fever status post total knee replacement approximately 1 week ago. Imaging showed large joint fluid effusion and mild cellulitis of the knee without complicating features. --afebrile, continue abx  Drug reaction with hives --mostly better oalthough now w/ hives on right side of face. Lips, tongue, mouth unaffected --although has always tolerated morphine in past, will stop --has take hydrocodone at home, will leave for now. Change to fentanyl for breaththrough  Hypothyroidism --Continue levothyroxine  Anxiety --Continue Zoloft, Elavil and Xanax as needed  Morbid obesity Body mass index is 42.34 kg/m. --dietician consult

## 2020-08-20 NOTE — Progress Notes (Signed)
   08/20/20 0312  Assess: MEWS Score  Temp (!) 102.4 F (39.1 C)  BP (!) 153/66  Pulse Rate 98  Resp 18  SpO2 96 %  O2 Device Room Air  Assess: MEWS Score  MEWS Temp 2  MEWS Systolic 0  MEWS Pulse 0  MEWS RR 0  MEWS LOC 0  MEWS Score 2  MEWS Score Color Yellow  Assess: if the MEWS score is Yellow or Red  Were vital signs taken at a resting state? Yes  Focused Assessment No change from prior assessment  Early Detection of Sepsis Score *See Row Information* Medium  MEWS guidelines implemented *See Row Information* Yes  Treat  MEWS Interventions Administered scheduled meds/treatments;Administered prn meds/treatments  Take Vital Signs  Increase Vital Sign Frequency  Yellow: Q 2hr X 2 then Q 4hr X 2, if remains yellow, continue Q 4hrs  Escalate  MEWS: Escalate Yellow: discuss with charge nurse/RN and consider discussing with provider and RRT  Notify: Charge Nurse/RN  Name of Charge Nurse/RN Notified Sonya, RN  Date Charge Nurse/RN Notified 08/20/20  Time Charge Nurse/RN Notified 0315  Notify: Provider  Provider Name/Title Webb Silversmith, NP  Date Provider Notified 08/20/20  Time Provider Notified [phone]  Notification Type Page  Notification Reason Change in status (yellow MEWS due to fever)  Provider response Other (Comment) (continue to monitor)  Date of Provider Response 08/20/20  Time of Provider Response 0421  Document  Patient Outcome Other (Comment) (conitue to monitor)

## 2020-08-21 DIAGNOSIS — T50905A Adverse effect of unspecified drugs, medicaments and biological substances, initial encounter: Secondary | ICD-10-CM

## 2020-08-21 DIAGNOSIS — L509 Urticaria, unspecified: Secondary | ICD-10-CM

## 2020-08-21 LAB — CBC
HCT: 38.1 % (ref 36.0–46.0)
Hemoglobin: 12.5 g/dL (ref 12.0–15.0)
MCH: 29.3 pg (ref 26.0–34.0)
MCHC: 32.8 g/dL (ref 30.0–36.0)
MCV: 89.2 fL (ref 80.0–100.0)
Platelets: 160 10*3/uL (ref 150–400)
RBC: 4.27 MIL/uL (ref 3.87–5.11)
RDW: 13.2 % (ref 11.5–15.5)
WBC: 5.1 10*3/uL (ref 4.0–10.5)
nRBC: 0 % (ref 0.0–0.2)

## 2020-08-21 LAB — BASIC METABOLIC PANEL
Anion gap: 9 (ref 5–15)
BUN: 14 mg/dL (ref 6–20)
CO2: 21 mmol/L — ABNORMAL LOW (ref 22–32)
Calcium: 8.3 mg/dL — ABNORMAL LOW (ref 8.9–10.3)
Chloride: 107 mmol/L (ref 98–111)
Creatinine, Ser: 0.81 mg/dL (ref 0.44–1.00)
GFR, Estimated: >60 mL/min (ref >60)
Glucose, Bld: 80 mg/dL (ref 70–99)
Potassium: 3.7 mmol/L (ref 3.5–5.1)
Sodium: 137 mmol/L (ref 135–145)

## 2020-08-21 MED ORDER — NYSTATIN 100000 UNIT/ML MT SUSP
5.0000 mL | Freq: Four times a day (QID) | OROMUCOSAL | Status: DC
  Administered 2020-08-21 – 2020-08-23 (×10): 500000 [IU] via ORAL
  Filled 2020-08-21 (×9): qty 5

## 2020-08-21 MED ORDER — MORPHINE SULFATE (PF) 4 MG/ML IV SOLN
4.0000 mg | INTRAVENOUS | Status: DC | PRN
  Administered 2020-08-21: 4 mg via INTRAVENOUS
  Filled 2020-08-21: qty 1

## 2020-08-21 MED ORDER — ENSURE MAX PROTEIN PO LIQD
11.0000 [oz_av] | Freq: Two times a day (BID) | ORAL | Status: DC
  Administered 2020-08-21 – 2020-08-23 (×5): 11 [oz_av] via ORAL
  Filled 2020-08-21: qty 330

## 2020-08-21 MED ORDER — PREDNISONE 20 MG PO TABS
40.0000 mg | ORAL_TABLET | Freq: Every day | ORAL | Status: DC
  Administered 2020-08-22 – 2020-08-23 (×2): 40 mg via ORAL
  Filled 2020-08-21 (×2): qty 2

## 2020-08-21 MED ORDER — DIPHENHYDRAMINE HCL 25 MG PO CAPS
25.0000 mg | ORAL_CAPSULE | Freq: Four times a day (QID) | ORAL | Status: DC
  Administered 2020-08-21 – 2020-08-23 (×8): 25 mg via ORAL
  Filled 2020-08-21 (×8): qty 1

## 2020-08-21 MED ORDER — ADULT MULTIVITAMIN W/MINERALS CH
1.0000 | ORAL_TABLET | Freq: Every day | ORAL | Status: DC
  Administered 2020-08-21 – 2020-08-22 (×2): 1 via ORAL
  Filled 2020-08-21 (×2): qty 1

## 2020-08-21 MED ORDER — FLUCONAZOLE 100 MG PO TABS
200.0000 mg | ORAL_TABLET | Freq: Once | ORAL | Status: AC
  Administered 2020-08-21: 200 mg via ORAL
  Filled 2020-08-21: qty 2

## 2020-08-21 MED ORDER — FLUCONAZOLE 50 MG PO TABS
150.0000 mg | ORAL_TABLET | Freq: Every day | ORAL | Status: DC
  Administered 2020-08-22 – 2020-08-23 (×2): 150 mg via ORAL
  Filled 2020-08-21 (×2): qty 1

## 2020-08-21 MED ORDER — METHYLPREDNISOLONE SODIUM SUCC 125 MG IJ SOLR
125.0000 mg | Freq: Once | INTRAMUSCULAR | Status: AC
  Administered 2020-08-21: 125 mg via INTRAVENOUS
  Filled 2020-08-21: qty 2

## 2020-08-21 MED ORDER — HYDROCODONE-ACETAMINOPHEN 5-325 MG PO TABS
1.0000 | ORAL_TABLET | Freq: Four times a day (QID) | ORAL | Status: DC | PRN
  Administered 2020-08-21 – 2020-08-23 (×6): 2 via ORAL
  Filled 2020-08-21 (×6): qty 2

## 2020-08-21 NOTE — Evaluation (Signed)
Physical Therapy Evaluation Patient Details Name: Marie Booker MRN: [id number] DOB: 02-18-1966 Today's Date: 08/21/2020   History of Present Illness  54 yo female with one week ond TKA was admitted and noted erythema and infection symptoms, cellulitis, medial L knee streaking upward on thigh.  Has fever, pain on L knee.  PMHx:  gastric sleeve 05/20/20, OA L Knee, asthma, hypothyroidism, RLS, morbid obesity, H pylori, anxiety, sleep apnea,  Clinical Impression  Pt is returning to the hosp after receiving a TKA at [facility] one week ago, now with cellulitis and pain on L knee.  Pt is demonstrating only 70 deg flex, -27 ext and strength 3- on L quads.  Will progress her as tolerated to strengthen and get L knee moving better, will encourage frequent walks and will work with nursing on pain management to progress her mobility in an efficient Ngu as the infection is controlled.  Follow along for acute PT goals, and will see as often as her stay permits.    Follow Up Recommendations Home health PT;Supervision for mobility/OOB    Equipment Recommendations  Other (comment) (shower chair)    Recommendations for Other Services       Precautions / Restrictions Precautions Precautions: Knee Precaution Comments: pt is maintaining mobility precautions Restrictions Weight Bearing Restrictions: No      Mobility  Bed Mobility Overal bed mobility: Needs Assistance Bed Mobility: Supine to Sit;Sit to Supine     Supine to sit: Supervision;HOB elevated (using bedrails) Sit to supine: HOB elevated;Supervision (using rails, supporting LLE with RLE)        Transfers Overall transfer level: Needs assistance Equipment used: Rolling walker (2 wheeled);1 person hand held assist Transfers: Sit to/from Stand Sit to Stand: Min guard         General transfer comment: min guard to stand at walker  Ambulation/Gait Ambulation/Gait assistance: Min guard Gait Distance (Feet): 150 Feet Assistive device:  Rolling walker (2 wheeled);1 person hand held assist Gait Pattern/deviations: Step-through pattern;Wide base of support;Decreased weight shift to left;Decreased stance time - left Gait velocity: reduced Gait velocity interpretation: <1.31 ft/sec, indicative of household ambulator General Gait Details: pt is limiting stance on LLE but is not unsafe on walker wiht supervised help  Stairs            Wheelchair Mobility    Modified Rankin (Stroke Patients Only)       Balance Overall balance assessment: Needs assistance   Sitting balance-Leahy Scale: Good     Standing balance support: Bilateral upper extremity supported;During functional activity Standing balance-Leahy Scale: Fair                               Pertinent Vitals/Pain Pain Assessment: 0-10 Pain Score: 4  Pain Location: L knee with WB Pain Descriptors / Indicators: Operative site guarding Pain Intervention(s): Limited activity within patient's tolerance;Repositioned;Premedicated before session;RN gave pain meds during session    Home Living Family/patient expects to be discharged to:: Private residence Living Arrangements: Spouse/significant other Available Help at Discharge: Family;Friend(s);Available PRN/intermittently Type of Home: House Home Access: Stairs to enter Entrance Stairs-Rails: Doctor, general practice of Steps: 5 Home Layout: One level Home Equipment: Walker - 2 wheels;Cane - single point;Bedside commode Additional Comments: has need for shower chair    Prior Function Level of Independence: Needs assistance   Gait / Transfers Assistance Needed: SPC on stairs, RW in house and outdoors  ADL's / Homemaking Assistance Needed: has family  and friend help for house but can handle her own bath        Hand Dominance   Dominant Hand: Right    Extremity/Trunk Assessment   Upper Extremity Assessment Upper Extremity Assessment: Overall WFL for tasks assessed    Lower  Extremity Assessment Lower Extremity Assessment: LLE deficits/detail LLE Deficits / Details: new TKA with quad weakness and postop stiffness LLE Coordination: decreased gross motor    Cervical / Trunk Assessment Cervical / Trunk Assessment: Normal  Communication   Communication: No difficulties  Cognition Arousal/Alertness: Awake/alert Behavior During Therapy: WFL for tasks assessed/performed Overall Cognitive Status: Within Functional Limits for tasks assessed                                        General Comments General comments (skin integrity, edema, etc.): Pt is up to walk wiht help and is maintaining a controlled upright posture on RW for gait.  Safe slow turns on walker, no LOB noted    Exercises     Assessment/Plan    PT Assessment Patient needs continued PT services  PT Problem List Decreased strength;Decreased range of motion;Decreased activity tolerance;Decreased balance;Decreased mobility;Decreased coordination;Decreased knowledge of use of DME;Decreased skin integrity;Pain       PT Treatment Interventions DME instruction;Gait training;Stair training;Functional mobility training;Therapeutic activities;Therapeutic exercise;Balance training;Neuromuscular re-education;Patient/family education    PT Goals (Current goals can be found in the Care Plan section)  Acute Rehab PT Goals Patient Stated Goal: to get home and feel better PT Goal Formulation: With patient Time For Goal Achievement: 09/04/20 Potential to Achieve Goals: Good    Frequency Min 2X/week   Barriers to discharge Inaccessible home environment;Decreased caregiver support home with stairs and home alone at times    Co-evaluation               AM-PAC PT "6 Clicks" Mobility  Outcome Measure Help needed turning from your back to your side while in a flat bed without using bedrails?: None Help needed moving from lying on your back to sitting on the side of a flat bed without  using bedrails?: A Little Help needed moving to and from a bed to a chair (including a wheelchair)?: A Little Help needed standing up from a chair using your arms (e.g., wheelchair or bedside chair)?: A Little Help needed to walk in hospital room?: A Little Help needed climbing 3-5 steps with a railing? : A Lot 6 Click Score: 18    End of Session Equipment Utilized During Treatment: Gait belt Activity Tolerance: Patient tolerated treatment well;Patient limited by pain Patient left: in bed;with call bell/phone within reach;with bed alarm set;with family/visitor present Nurse Communication: Mobility status PT Visit Diagnosis: Unsteadiness on feet (R26.81);Muscle weakness (generalized) (M62.81);Pain Pain - Right/Left: Left Pain - part of body: Knee    Time: 9242-6834 PT Time Calculation (min) (ACUTE ONLY): 31 min   Charges:   PT Evaluation $PT Eval Moderate Complexity: 1 Mod PT Treatments $Gait Training: 8-22 mins       Ivar Drape 08/21/2020, 2:28 PM  Samul Dada, PT MS Acute Rehab Dept. Number: [facility] [id number] and [facility] [phone]

## 2020-08-21 NOTE — Progress Notes (Signed)
Subjective:  Patient reports pain as moderate.  Morphine is working well, but states it stops working after 2 or 3 hours. She is complaining of itching throughout her body. Also feels "raw" in groin creases, perineum and mouth.   Objective:   VITALS:   Vitals:   08/20/20 2334 08/21/20 0140 08/21/20 0509 08/21/20 0826  BP: 103/61 (!) 111/55 (!) 113/43 122/64  Pulse: 86 82 81 94  Resp: 18  18 17   Temp: 98.9 F (37.2 C)  98.3 F (36.8 C) (!) 100.7 F (38.2 C)  TempSrc:      SpO2: 98%  99% 98%  Weight:      Height:        PHYSICAL EXAM:  ABD soft Sensation intact distally Dorsiflexion/Plantar flexion intact Incision: dressing C/D/I Compartment soft  LABS  Results for orders placed or performed during the hospital encounter of 08/19/20 (from the past 24 hour(s))  Surgical pcr screen     Status: None   Collection Time: 08/20/20  3:42 PM   Specimen: Nasal Mucosa; Nasal Swab  Result Value Ref Range   MRSA, PCR NEGATIVE NEGATIVE   Staphylococcus aureus NEGATIVE NEGATIVE  CBC     Status: None   Collection Time: 08/21/20  5:00 AM  Result Value Ref Range   WBC 5.1 4.0 - 10.5 K/uL   RBC 4.27 3.87 - 5.11 MIL/uL   Hemoglobin 12.5 12.0 - 15.0 g/dL   HCT 10/21/20 99.2 - 42.6 %   MCV 89.2 80.0 - 100.0 fL   MCH 29.3 26.0 - 34.0 pg   MCHC 32.8 30.0 - 36.0 g/dL   RDW 83.4 19.6 - 22.2 %   Platelets 160 150 - 400 K/uL   nRBC 0.0 0.0 - 0.2 %  Basic metabolic panel     Status: Abnormal   Collection Time: 08/21/20  5:00 AM  Result Value Ref Range   Sodium 137 135 - 145 mmol/L   Potassium 3.7 3.5 - 5.1 mmol/L   Chloride 107 98 - 111 mmol/L   CO2 21 (L) 22 - 32 mmol/L   Glucose, Bld 80 70 - 99 mg/dL   BUN 14 6 - 20 mg/dL   Creatinine, Ser 10/21/20 0.44 - 1.00 mg/dL   Calcium 8.3 (L) 8.9 - 10.3 mg/dL   GFR, Estimated 8.92 >11 mL/min   Anion gap 9 5 - 15    CT FEMUR LEFT WO CONTRAST  Result Date: 08/19/2020 CLINICAL DATA:  Left leg pain and swelling, 7 days status post left knee  surgery. EXAM: CT OF THE LOWER LEFT EXTREMITY WITHOUT CONTRAST TECHNIQUE: Multidetector CT imaging of the lower left extremity was performed according to the standard protocol. COMPARISON:  None. FINDINGS: Bones/Joint/Cartilage A left knee replacement is seen with an extensive amount of associated streak artifact. Subsequently limited evaluation of the adjacent osseous and soft tissue structures is noted. There is no evidence of an acute fracture or dislocation. A large joint effusion is seen. Ligaments Suboptimally assessed by CT. Muscles and Tendons A small amount of para muscular soft tissue air is seen along the anteromedial aspect of the distal right thigh. Soft tissues Mild subcutaneous and para muscular inflammatory fat stranding is seen along the anterior and lateral aspects of the left knee. Multiple anterior skin staples are present. There is no evidence of associated fluid collection or abscess. IMPRESSION: 1. Status post left knee replacement. 2. Large joint effusion. 3. Mild cellulitis along the anterior and lateral aspects of the left knee without evidence of  an associated fluid collection or abscess. Electronically Signed   By: Aram Candela M.D.   On: 08/19/2020 22:13   US Venous Img Lower Unilateral Left  Result Date: 08/19/2020 CLINICAL DATA:  Left lower extremity pain EXAM: Left  LOWER EXTREMITY VENOUS DOPPLER ULTRASOUND TECHNIQUE: Gray-scale sonography with compression, as well as color and duplex ultrasound, were performed to evaluate the deep venous system(s) from the level of the common femoral vein through the popliteal and proximal calf veins. COMPARISON:  None. FINDINGS: VENOUS Normal compressibility of the common femoral, superficial femoral, and popliteal veins, as well as the visualized calf veins. Visualized portions of profunda femoral vein and great saphenous vein unremarkable. No filling defects to suggest DVT on grayscale or color Doppler imaging. Doppler waveforms show  normal direction of venous flow, normal respiratory plasticity and response to augmentation. Limited views of the contralateral common femoral vein are unremarkable. OTHER None. Limitations: none IMPRESSION: Negative. Electronically Signed   By: Jonna Clark M.D.   On: 08/19/2020 23:08   DG Chest Port 1 View  Result Date: 08/19/2020 CLINICAL DATA:  Postop knee surgery. EXAM: PORTABLE CHEST 1 VIEW COMPARISON:  December 08, 2010 FINDINGS: The heart size and mediastinal contours are within normal limits. Both lungs are clear. The visualized skeletal structures are unremarkable. IMPRESSION: No active disease. Electronically Signed   By: Katherine Mantle M.D.   On: 08/19/2020 21:18    Assessment/Plan:     Principal Problem:   Cellulitis of left lower extremity Active Problems:   Anxiety   Hypothyroidism   Obesity, Class III, BMI 40-49.9 (morbid obesity) (HCC)  Severe rash likely from dilaudid IV, OK for IV steroids from ortho standpoint Yeast infection would recommend oral diflucan, topical as ordered and magic mouthwash Up with therapy weight bear as tolerated Continue ABX therapy due to Post-op cellulitis Plan for discharge likely Monday if fever and rash resolve   Marie Booker , MD 08/21/2020, 9:13 AM

## 2020-08-21 NOTE — Progress Notes (Signed)
PROGRESS NOTE  Marie Booker [id number]:[id number] DOB: [birth year] DOA: 08/19/2020 PCP: Alan Mulder, MD  Brief History   55 year old woman status post total knee replacement at [facility] approximately 7 days ago presented with several days of fever, leg erythema and worsening left knee pain.  A & P  Left knee and leg cellulitis with fever status post total knee replacement approximately 1 week ago. Imaging showed large joint fluid effusion and mild cellulitis of the knee without complicating features. --Now afebrile after discontinuation of Dilaudid. Continue antibiotics, appears to be improving.  Drug reaction with hives --Interesting case. Apparently had itching with Dilaudid during a prior hospitalization. Treated with oral Dilaudid as an outpatient which she reports she tolerated although she has some mild itching. In the hospital treated with IV Dilaudid which caused hives, now improving after being discontinued. --Schedule Benadryl. Give Solu-Medrol. Home on prednisone 1 better.  Hypothyroidism --Continue levothyroxine  Anxiety --Continue Zoloft, Elavil and Xanax as needed  Morbid obesity Body mass index is 42.34 kg/m. --dietician consult  Disposition Plan:  Discussion: Feels better today, rash is resolving. Fever resolved, suspect drug fever. Curious case that she had fever at home and erythema of her left leg. Today she has a dramatic change from when I last examined her. Yesterday she had erythema there was confluent the left thigh and was very warm to touch. Today she has resolving hives distributed both legs and arms. Will give Diflucan for yeast infection and nystatin for thrush. Seems to be tolerating morphine. Will add hydrocodone which patient reported tolerating in the past.  Status is: Inpatient  Remains inpatient appropriate because:IV treatments appropriate due to intensity of illness or inability to take PO and Inpatient level of care appropriate due to  severity of illness   Dispo: The patient is from: Home              Anticipated d/c is to: Home              Patient currently is not medically stable to d/c.   Difficult to place patient No  DVT prophylaxis: heparin injection 5,000 Units Start: 08/20/20 0600   Code Status: Full Code Level of care: Med-Surg Family Communication:   Brendia Sacks, MD  [facility] Direct contact: see [email or web address] (further directions at bottom of note if needed) 7PM-7AM contact night coverage as at bottom of note 08/21/2020, 2:52 PM  LOS: 1 day   Significant Hospital Events   .    Consults:  . Orthopedics  . ID   Procedures:  .   Significant Diagnostic Tests:  Marland Kitchen    Micro Data:  .    Antimicrobials:  .   Interval History/Subjective  CC: f/u fever  Feels better today Itching and rash improved Still has some knee pain  Objective   Vitals:  Vitals:   08/21/20 0509 08/21/20 0826  BP: (!) 113/43 122/64  Pulse: 81 94  Resp: 18 17  Temp: 98.3 F (36.8 C) (!) 100.7 F (38.2 C)  SpO2: 99% 98%    Exam:  Constitutional:   . Appears calm and comfortable, appears better today ENMT:  . grossly normal hearing  . Tongue appears unremarkable Respiratory:  . CTA bilaterally, no w/r/r.  . Respiratory effort normal.  Cardiovascular:  . RRR, no m/r/g . No LE extremity edema   Skin:  . Resolving hives BLE and BUE Psychiatric:  . Mental status o Mood, affect appropriate  I have personally reviewed the following:  Today's Data  . BC NGTD . BMP and CBC unremarkable  Scheduled Meds: . amitriptyline  25 mg Oral QHS  . diphenhydrAMINE  25 mg Oral Q6H  . [START ON 08/22/2020] fluconazole  150 mg Oral Daily  . heparin  5,000 Units Subcutaneous Q8H  . multivitamin with minerals  1 tablet Oral Daily  . nystatin  5 mL Oral QID  . nystatin   Topical BID  . Ensure Max Protein  11 oz Oral BID  . rOPINIRole  2 mg Oral QHS  . sertraline  100 mg Oral QHS  . thyroid  180 mg Oral  QAC breakfast   Continuous Infusions: .  ceFAZolin (ANCEF) IV 1 g (08/21/20 1356)    Principal Problem:   Cellulitis of left lower extremity Active Problems:   Anxiety   Hypothyroidism   Obesity, Class III, BMI 40-49.9 (morbid obesity) (HCC)   LOS: 1 day   How to contact the [facility] Attending or Consulting provider 7A - 7P or covering provider during after hours 7P -7A, for this patient?  1. Check the care team in [facility] and look for a) attending/consulting TRH provider listed and b) the [facility] team listed 2. Log into [email or web address] and use [place]'s universal password to access. If you do not have the password, please contact the hospital operator. 3. Locate the [facility] provider you are looking for under [facility] and page to a number that you can be directly reached. 4. If you still have difficulty reaching the provider, please page the [facility] (Director on Call) for the Hospitalists listed on amion for assistance.

## 2020-08-21 NOTE — Progress Notes (Signed)
Initial Nutrition Assessment  DOCUMENTATION CODES:      INTERVENTION:     NUTRITION DIAGNOSIS:     related to   as evidenced by  .    GOAL:       MONITOR:      REASON FOR ASSESSMENT:   Consult Assessment of nutrition requirement/status  ASSESSMENT:  55 year old female admitted with cellulitis of left lower extremity s/p total knee replacement at [facility] ~ 1 week ago. 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 pylori, anxiety, and sleep apnea presented with worsening left leg pain, erythema at medial part of left knee tracking up left thigh, and fever.  RD working remotely.  Surgical wound site without drainage, large effusion noted on CT imaging. Imaging with large effusion.  Attempted to reach pt via phone this morning, however received busy signal x 2. RD consulted for assessment of nutritional status related to morbid obesity. Obesity is a complex, chronic medical condition that is optimally managed by a multidisciplinary care team. Weight loss is not an ideal goal for acute inpatient hospitalization. Of note, she is s/p recent robotic sleeve gastrectomy on 12/2/2 and is followed by [facility]. Per 12/20 office visit, pt weighed 127.9 kg (281.38 lbs) on 12/2 and currently she weighs 119 kg (261.8 lbs) indicating ~20 lbs (7%) wt loss in the last 3 months.   Pt does have increased needs at this time to support post-operative given recent knee replacement, will order supplements to help her meet her needs.  Medications reviewed and include: Benadryl, Mycostatin, Requip, IV Ancef  Labs reviewed    NUTRITION - FOCUSED PHYSICAL EXAM:  Unable to complete at this time  Diet Order:   Diet Order            Diet regular Room service appropriate? Yes; Fluid consistency: Thin  Diet effective now                 EDUCATION NEEDS:      Skin:  Skin  Assessment: Skin Integrity Issues: Skin Integrity Issues:: Incisions Incisions: closed; left knee (poa); cellulitis  Last BM:  3/2  Height:   Ht Readings from Last 1 Encounters:  08/19/20 5\' 6"  (1.676 m)    Weight:   Wt Readings from Last 1 Encounters:  08/19/20 119 kg    BMI:  Body mass index is 42.34 kg/m.  Estimated Nutritional Needs:   Kcal:  2200-2400  Protein:  120-135  Fluid:  >2 L/day   10/19/20, RD, LDN Clinical Nutrition After Hours/Weekend Pager # in Amion

## 2020-08-22 MED ORDER — FENTANYL CITRATE (PF) 100 MCG/2ML IJ SOLN
12.5000 ug | INTRAMUSCULAR | Status: DC | PRN
  Administered 2020-08-22 (×2): 12.5 ug via INTRAVENOUS
  Filled 2020-08-22 (×2): qty 2

## 2020-08-22 MED ORDER — PANTOPRAZOLE SODIUM 40 MG PO TBEC: 40.0000 mg | DELAYED_RELEASE_TABLET | Freq: Two times a day (BID) | ORAL | Status: DC

## 2020-08-22 MED ORDER — ENOXAPARIN SODIUM 60 MG/0.6ML ~~LOC~~ SOLN
0.5000 mg/kg | SUBCUTANEOUS | Status: DC
  Administered 2020-08-22 – 2020-08-23 (×2): 60 mg via SUBCUTANEOUS
  Filled 2020-08-22 (×2): qty 0.6

## 2020-08-22 MED ORDER — BENZOCAINE 10 % MT GEL
Freq: Two times a day (BID) | OROMUCOSAL | Status: DC | PRN
  Filled 2020-08-22: qty 9

## 2020-08-22 MED ORDER — PANTOPRAZOLE SODIUM 40 MG PO TBEC
40.0000 mg | DELAYED_RELEASE_TABLET | Freq: Every day | ORAL | Status: DC
  Administered 2020-08-22 – 2020-08-23 (×2): 40 mg via ORAL
  Filled 2020-08-22 (×2): qty 1

## 2020-08-22 MED ORDER — METHOCARBAMOL 500 MG PO TABS
500.0000 mg | ORAL_TABLET | Freq: Four times a day (QID) | ORAL | Status: DC | PRN
  Administered 2020-08-22 – 2020-08-23 (×2): 500 mg via ORAL
  Filled 2020-08-22 (×2): qty 1

## 2020-08-22 NOTE — Progress Notes (Signed)
PROGRESS NOTE  Marie Booker FUX:[id number] DOB: Feb 29, 1966 DOA: 08/19/2020 PCP: Alan Mulder, MD  Brief History   55 year old woman status post total knee replacement at [facility] approximately 7 days ago presented with several days of fever, leg erythema and worsening left knee pain. Treated w/ abx w/ improvement, but probably mostly drug fever and hives from Dilaudid. Improving on steroids, benadryl, home soon.  A & P  Left knee and leg cellulitis with fever status post total knee replacement approximately 1 week ago. Imaging showed large joint fluid effusion and mild cellulitis of the knee without complicating features. --afebrile, continue abx  Drug reaction with hives --mostly better oalthough now w/ hives on right side of face. Lips, tongue, mouth unaffected --although has always tolerated morphine in past, will stop --has take hydrocodone at home, will leave for now. Change to fentanyl for breaththrough  Hypothyroidism --Continue levothyroxine  Anxiety --Continue Zoloft, Elavil and Xanax as needed  Morbid obesity Body mass index is 42.34 kg/m. --dietician consult  Disposition Plan:  Discussion: overall better, will adjust meds as above, if stable tomorrow ill discharge home  Status is: Inpatient  Remains inpatient appropriate because:IV treatments appropriate due to intensity of illness or inability to take PO and Inpatient level of care appropriate due to severity of illness   Dispo: The patient is from: Home              Anticipated d/c is to: Home              Patient currently is not medically stable to d/c.   Difficult to place patient No  DVT prophylaxis:    Code Status: Full Code Level of care: Med-Surg Family Communication: none  Brendia Sacks, MD  [facility] Direct contact: see [email or web address] (further directions at bottom of note if needed) 7PM-7AM contact night coverage as at bottom of note 08/22/2020, 1:29 PM  LOS: 2 days    Significant Hospital Events   .    Consults:  . Orthopedics  . ID   Procedures:  .   Significant Diagnostic Tests:  Marland Kitchen    Micro Data:  .    Antimicrobials:  .   Interval History/Subjective  CC: f/u fever  Less itching, overall better except now has rash on face and blisters on lips No knee pain now and overall less Can move knee better More muscle spasms than pain  Objective   Vitals:  Vitals:   08/22/20 0811 08/22/20 1154  BP: (!) 121/59 (!) 125/56  Pulse: 64 75  Resp: 17 18  Temp: 97.6 F (36.4 C) 97.7 F (36.5 C)  SpO2: 97% 95%    Exam:  Constitutional:   . Appears calm and comfortable ENMT:  . grossly normal hearing  Respiratory:  . CTA bilaterally, no w/r/r.  . Respiratory effort normal. Cardiovascular:  . RRR, no m/r/g . No LE extremity edema   Musculoskeletal:  . Moves left leg well and bends knee 30 degrees Skin:  . Fading rash arms, legs (except left thigh) . New hives right face . Lips and tongue w/o obvious abnormalities. Psychiatric:  . Mental status o Mood, affect appropriate  I have personally reviewed the following:   Today's Data  . BC NGTD 3/6  Scheduled Meds: . diphenhydrAMINE  25 mg Oral Q6H  . enoxaparin (LOVENOX) injection  0.5 mg/kg Subcutaneous Q24H  . fluconazole  150 mg Oral Daily  . nystatin  5 mL Oral QID  . nystatin  Topical BID  . pantoprazole  40 mg Oral Daily  . predniSONE  40 mg Oral Q breakfast  . Ensure Max Protein  11 oz Oral BID  . rOPINIRole  2 mg Oral QHS  . sertraline  100 mg Oral QHS  . thyroid  180 mg Oral QAC breakfast   Continuous Infusions: .  ceFAZolin (ANCEF) IV 1 g (08/22/20 0523)    Principal Problem:   Cellulitis of left lower extremity Active Problems:   Anxiety   Hypothyroidism   Obesity, Class III, BMI 40-49.9 (morbid obesity) (HCC)   Hives   Drug reaction   LOS: 2 days   How to contact the [facility] Attending or Consulting provider 7A - 7P or covering provider during  after hours 7P -7A, for this patient?  1. Check the care team in [facility] and look for a) attending/consulting TRH provider listed and b) the [facility] team listed 2. Log into [email or web address] and use [place]'s universal password to access. If you do not have the password, please contact the hospital operator. 3. Locate the [facility] provider you are looking for under [facility] and page to a number that you can be directly reached. 4. If you still have difficulty reaching the provider, please page the [facility] (Director on Call) for the Hospitalists listed on amion for assistance.

## 2020-08-22 NOTE — Plan of Care (Signed)

## 2020-08-22 NOTE — Progress Notes (Signed)
  Subjective:  Patient reports pain as mild.  Itching has improved with steroids.  Objective:   VITALS:   Vitals:   08/22/20 0220 08/22/20 0551 08/22/20 0811 08/22/20 1154  BP: (!) 100/55 (!) 105/56 (!) 121/59 (!) 125/56  Pulse: (!) 59 67 64 75  Resp:  15 17 18   Temp:  97.6 F (36.4 C) 97.6 F (36.4 C) 97.7 F (36.5 C)  TempSrc:      SpO2:  97% 97% 95%  Weight:      Height:        PHYSICAL EXAM:  ABD soft Sensation intact distally Dorsiflexion/Plantar flexion intact Incision: dressing C/D/I Compartment soft  LABS  No results found for this or any previous visit (from the past 24 hour(s)).  No results found.  Assessment/Plan:     Principal Problem:   Cellulitis of left lower extremity Active Problems:   Anxiety   Hypothyroidism   Obesity, Class III, BMI 40-49.9 (morbid obesity) (HCC)   Hives   Drug reaction   Up with therapy Plan for discharge tomorrow     , MD 08/22/2020, 12:46 PM

## 2020-08-22 NOTE — Progress Notes (Signed)
PHARMACIST - PHYSICIAN COMMUNICATION  CONCERNING:  Enoxaparin (Lovenox) for DVT Prophylaxis    RECOMMENDATION: Patient was prescribed enoxaprin 40mg  q24 hours for VTE prophylaxis.   Filed Weights   08/19/20 2052  Weight: 119 kg (262 lb 5.6 oz)    Body mass index is 42.34 kg/m.  Estimated Creatinine Clearance: 101.9 mL/min (by C-G formula based on SCr of 0.81 mg/dL).   Based on [facility] policy patient is candidate for enoxaparin 0.5mg /kg TBW SQ every 24 hours based on BMI being >30 (42)  DESCRIPTION: Pharmacy has adjusted enoxaparin dose per [facility] policy.  Patient is now receiving enoxaparin 60 mg every 24 hours    [facility], PharmD Clinical Pharmacist  08/22/2020 11:38 AM

## 2020-08-23 DIAGNOSIS — R21 Rash and other nonspecific skin eruption: Secondary | ICD-10-CM | POA: Diagnosis not present

## 2020-08-23 DIAGNOSIS — T7840XA Allergy, unspecified, initial encounter: Secondary | ICD-10-CM

## 2020-08-23 MED ORDER — DIPHENHYDRAMINE HCL 25 MG PO CAPS: 25.0000 mg | ORAL_CAPSULE | Freq: Four times a day (QID) | ORAL | 0 refills | Status: AC | PRN

## 2020-08-23 MED ORDER — METHOCARBAMOL 500 MG PO TABS: 500.0000 mg | ORAL_TABLET | Freq: Four times a day (QID) | ORAL | 0 refills | Status: AC | PRN

## 2020-08-23 MED ORDER — ACETAMINOPHEN 500 MG PO TABS: 500.0000 mg | ORAL_TABLET | Freq: Four times a day (QID) | ORAL | 0 refills | Status: AC | PRN

## 2020-08-23 MED ORDER — TRAZODONE HCL 50 MG PO TABS
50.0000 mg | ORAL_TABLET | Freq: Every evening | ORAL | Status: DC | PRN
  Administered 2020-08-23: 50 mg via ORAL
  Filled 2020-08-23: qty 1

## 2020-08-23 MED ORDER — NYSTATIN 100000 UNIT/ML MT SUSP: 5.0000 mL | Freq: Four times a day (QID) | OROMUCOSAL | 0 refills | Status: AC

## 2020-08-23 MED ORDER — ASPIRIN 81 MG PO CHEW
81.0000 mg | CHEWABLE_TABLET | Freq: Two times a day (BID) | ORAL | Status: AC
Start: 2020-08-23 — End: ?

## 2020-08-23 MED ORDER — ASPIRIN 81 MG PO CHEW: 81.0000 mg | CHEWABLE_TABLET | Freq: Two times a day (BID) | ORAL | Status: DC

## 2020-08-23 MED ORDER — PREDNISONE 10 MG PO TABS: ORAL_TABLET | ORAL | 0 refills | Status: DC

## 2020-08-23 NOTE — Progress Notes (Signed)
  Subjective:  Patient reports pain as mild.    Itching has improved with steroids  Objective:   VITALS:   Vitals:   08/23/20 0106 08/23/20 0547 08/23/20 0753 08/23/20 1148  BP: (!) 121/53 92/77 (!) 142/76 (!) 142/79  Pulse: 60 61 62 60  Resp: 16 16 18 18   Temp: 97.8 F (36.6 C) 97.7 F (36.5 C) 98.1 F (36.7 C) 97.7 F (36.5 C)  TempSrc: Oral Oral Oral Oral  SpO2: 98% 100% 99% 97%  Weight:      Height:        PHYSICAL EXAM:  Neurologically intact ABD soft Neurovascular intact Sensation intact distally Intact pulses distally Dorsiflexion/Plantar flexion intact Incision: no drainage Compartment soft  LABS  No results found for this or any previous visit (from the past 24 hour(s)).  No results found.  Assessment/Plan:     Principal Problem:   Cellulitis of left lower extremity Active Problems:   Anxiety   Hypothyroidism   Obesity, Class III, BMI 40-49.9 (morbid obesity) (HCC)   Hives   Drug reaction   Up with therapy  Discharge home today Continue aspirin 81 mg BID Follow up in 1 week for staple removal call to confirm appointment [phone]    [phone] , MD 08/23/2020, 1:11 PM

## 2020-08-23 NOTE — Progress Notes (Signed)
   Date of Admission:  08/19/2020      ID: Marie Booker is a 55 y.o. female  Principal Problem:   Cellulitis of left lower extremity Active Problems:   Anxiety   Hypothyroidism   Obesity, Class III, BMI 40-49.9 (morbid obesity) (HCC)   Hives   Drug reaction    Subjective: Pt was started on steroids over the weekend for the rash and is feeling better now No fever  Medications:  . diphenhydrAMINE  25 mg Oral Q6H  . enoxaparin (LOVENOX) injection  0.5 mg/kg Subcutaneous Q24H  . fluconazole  150 mg Oral Daily  . nystatin  5 mL Oral QID  . nystatin   Topical BID  . pantoprazole  40 mg Oral Daily  . predniSONE  40 mg Oral Q breakfast  . Ensure Max Protein  11 oz Oral BID  . rOPINIRole  2 mg Oral QHS  . sertraline  100 mg Oral QHS  . thyroid  180 mg Oral QAC breakfast    Objective: Vital signs in last 24 hours: Temp:  [97.7 F (36.5 C)-98.2 F (36.8 C)] 97.7 F (36.5 C) (03/07 1148) Pulse Rate:  [60-77] 60 (03/07 1148) Resp:  [16-18] 18 (03/07 1148) BP: (92-142)/(53-79) 142/79 (03/07 1148) SpO2:  [97 %-100 %] 97 % (03/07 1148)  PHYSICAL EXAM:  General: Alert, cooperative,  Head: Normocephalic, without obvious abnormality, atraumatic. Eyes: Conjunctivae clear, anicteric sclerae. Pupils are equal ENT Nares normal. No drainage or sinus tenderness. Oral mucosa on the roof of the mouth has some erythema Neck: Supple, symmetrical, no adenopathy, thyroid: non tender no carotid bruit and no JVD. Back: No CVA tenderness. Lungs: Clear to auscultation bilaterally. No Wheezing or Rhonchi. No rales. Heart: Regular rate and rhythm, no murmur, rub or gallop. Abdomen: Soft, non-tender,not distended. Bowel sounds normal. No masses Extremities: atraumatic, no cyanosis. No edema. No clubbing Skin:                    Lymph: Cervical, supraclavicular normal. Neurologic: Grossly non-focal  Lab Results Recent Labs    08/21/20 0500  WBC 5.1  HGB 12.5  HCT 38.1  NA  137  K 3.7  CL 107  CO2 21*  BUN 14  CREATININE 0.81   Studies/Results: No results found.   Assessment/Plan: Erythematous rash -Drug allergy ? Dilaudid/opiods  ? vanco Pt has h/o IV dilaudid induced itching and hives a few years ago, but was given PO dilaudid a few days before presentation for left knee post op pain She presented with feer, left thigh eryhthema and was diagnosed as cellulitis and started on IV vanco/cefepime.then ceftriaoxone and also IV dilaudid She only received 1 dose of vanco, 1 dose of cefepime, 1 dose of ceftriaxone-and currently on cefazolin.  Received nearly 10 doses of dilaudid and morphine but they were discontinued started on steroids over the weekend and the rash has improved  No evidence of surgical site infection or cellulitis Will DC cefazolin  Left knee prosthesis  Discussed the management with patient, hospitalist and ortho Will need referral for allergy testing as OP Will follow her as Op

## 2020-08-23 NOTE — Progress Notes (Signed)
Physical Therapy Treatment Patient Details Name: Marie Booker MRN: [id number] DOB: 1965-11-05 Today's Date: 08/23/2020    History of Present Illness 55 yo female with one week ond TKA was admitted and noted erythema and infection symptoms, cellulitis, medial L knee streaking upward on thigh.  Has fever, pain on L knee.  PMHx:  gastric sleeve 05/20/20, OA L Knee, asthma, hypothyroidism, RLS, morbid obesity, H pylori, anxiety, sleep apnea,    PT Comments    Pt received in Semi-Fowler's position and agreeable to therapy.   Pt was able to perform all mobility for bed with supervision and most of ambulation with CGA initially, then supervision for remainder of session.  Pt encouraged to continue with therapeutic exercises that were given to her and educated on the importance of maintaining straightened leg for terminal knee extension that is required for adequate gait mechanics.  Pt understood education and agreed to utilized bone foam throughout rehab process.  Pt able to ambulate well, and will continue to be monitored throughout hospital stay.  Pt would be best served with d/c to home with OPPT services going forward.      Follow Up Recommendations  Outpatient PT     Equipment Recommendations  Other (comment) (shower chair)    Recommendations for Other Services       Precautions / Restrictions Precautions Precautions: Knee Precaution Comments: pt is maintaining mobility precautions Restrictions Weight Bearing Restrictions: No    Mobility  Bed Mobility Overal bed mobility: Needs Assistance Bed Mobility: Supine to Sit;Sit to Supine     Supine to sit: Supervision;HOB elevated Sit to supine: HOB elevated;Supervision        Transfers Overall transfer level: Needs assistance Equipment used: Rolling walker (2 wheeled);1 person hand held assist Transfers: Sit to/from Stand Sit to Stand: Min guard         General transfer comment: min guard to stand at  walker  Ambulation/Gait Ambulation/Gait assistance: Min guard Gait Distance (Feet): 500 Feet Assistive device: Rolling walker (2 wheeled);1 person hand held assist Gait Pattern/deviations: Step-through pattern;Wide base of support;Decreased weight shift to left;Decreased stance time - left Gait velocity: reduced   General Gait Details: pt ambulates well with FWW.   Stairs             Wheelchair Mobility    Modified Rankin (Stroke Patients Only)       Balance Overall balance assessment: Needs assistance   Sitting balance-Leahy Scale: Good     Standing balance support: Bilateral upper extremity supported;During functional activity Standing balance-Leahy Scale: Fair                              Cognition Arousal/Alertness: Awake/alert Behavior During Therapy: WFL for tasks assessed/performed Overall Cognitive Status: Within Functional Limits for tasks assessed                                        Exercises      General Comments        Pertinent Vitals/Pain Pain Assessment: No/denies pain    Home Living                      Prior Function            PT Goals (current goals can now be found in the care plan section) Acute Rehab PT  Goals Patient Stated Goal: to get home and feel better PT Goal Formulation: With patient Time For Goal Achievement: 09/04/20 Potential to Achieve Goals: Good Progress towards PT goals: Progressing toward goals    Frequency    Min 2X/week      PT Plan      Co-evaluation              AM-PAC PT "6 Clicks" Mobility   Outcome Measure  Help needed turning from your back to your side while in a flat bed without using bedrails?: None Help needed moving from lying on your back to sitting on the side of a flat bed without using bedrails?: None Help needed moving to and from a bed to a chair (including a wheelchair)?: None Help needed standing up from a chair using your arms  (e.g., wheelchair or bedside chair)?: A Little Help needed to walk in hospital room?: A Little Help needed climbing 3-5 steps with a railing? : A Little 6 Click Score: 21    End of Session Equipment Utilized During Treatment: Gait belt Activity Tolerance: Patient tolerated treatment well;Patient limited by pain Patient left: in bed;with call bell/phone within reach;with bed alarm set;with family/visitor present Nurse Communication: Mobility status PT Visit Diagnosis: Unsteadiness on feet (R26.81);Muscle weakness (generalized) (M62.81);Pain Pain - Right/Left: Left Pain - part of body: Knee     Time: 6063-0160 PT Time Calculation (min) (ACUTE ONLY): 67 min  Charges:  $Gait Training: 23-37 mins $Therapeutic Exercise: 23-37 mins                     Nolon Bussing, PT, DPT 08/23/20, 5:51 PM

## 2020-08-23 NOTE — Plan of Care (Signed)

## 2020-08-23 NOTE — Discharge Summary (Signed)
Physician Discharge Summary  Marie Booker JXB:[id number] DOB: May 14, 1966 DOA: 08/19/2020  PCP: Alan Mulder, MD  Admit date: 08/19/2020 Discharge date: 08/23/2020  Recommendations for Outpatient Follow-up:   Drug reaction with hives --Secondary to Dilaudid, added to allergy list. --Expect slow improvement in the outpatient setting, discharged on steroid taper, Benadryl as needed --follow-up with Infectious Disease as an outpatient  Status post total knee replacement --Keep follow-up with Dr. Odis Luster as an outpatient   Follow-up Information    Morayati, Delsa Sale, MD Follow up in 1 week(s).   Specialty: Endocrinology Contact information: [address] [place] [address] [phone]        Lynn Ito, MD Follow up.   Specialty: Infectious Diseases Why: Office will call you with appointment Contact information: [address] [place] [address] [phone]        Lyndle Herrlich, MD. Schedule an appointment as soon as possible for a visit in 1 week(s).   Specialty: Orthopedic Surgery Contact information: [address] [place] [address] [phone]                Discharge Diagnoses: Principal diagnosis is #1 Principal Problem:   Cellulitis of left lower extremity Active Problems:   Anxiety   Hypothyroidism   Obesity, Class III, BMI 40-49.9 (morbid obesity) (HCC)   Hives   Drug reaction   Discharge Condition: improved Disposition: home  Diet recommendation:  Diet Orders (From admission, onward)    Start     Ordered   08/23/20 0000  Diet - low sodium heart healthy        08/23/20 1517   08/20/20 1228  Diet regular Room service appropriate? Yes; Fluid consistency: Thin  Diet effective now       Question Answer Comment  Room service appropriate? Yes   Fluid consistency: Thin      08/20/20 1227           Filed Weights   08/19/20 2052  Weight: 119 kg    HPI/Hospital Course:   55 year old woman status  post total knee replacement at [facility] approximately 7 days ago presented with several days of fever, leg erythema and worsening left knee pain.  Initial presentation concerning for cellulitis and possible joint infection, treated with broad-spectrum antibiotics and pain control with Dilaudid.  Subsequently developed fever, worsening erythema and then generalized hives and rash.  Patient reported itching with Dilaudid in the past and this medication was discontinued.  She was started on steroids and consultation with orthopedics as well as Benadryl with gradual clinical improvement.  Rash overall stable or improving.  Discussed in detail with infectious disease, suspect at this point adverse drug reaction with drug fever and rash, initially in the outpatient setting secondary to oral Dilaudid, then exacerbated by IV Dilaudid.  Discharged home on prednisone taper, Robaxin which patient has tolerated in the past.  She will follow up with infectious disease to ensure resolution.  Keep follow-up with orthopedics.  Concern for left knee and leg cellulitis with fever status post total knee replacement approximately 1 week ago. Imaging showed large joint fluid effusion and mild cellulitis of the knee without complicating features. --However further evaluation and clinical course most consistent with adverse drug reaction with drug fevers and hives secondary to Dilaudid. --Cellulitis ruled out at this point.  No antibiotics on discharge per infectious disease.  Drug reaction with hives --Secondary to Dilaudid, added to allergy list. --Expect slow improvement in the outpatient setting, discharged  on steroid taper, Benadryl as needed  Oral thrush --Nystatin  Hypothyroidism --Continue levothyroxine  Anxiety --Continue Zoloft, Elavil and Xanax as needed  Morbid obesity Body mass index is 42.34 kg/m.   Consults:  . Orthopedics . ID   Today's assessment: S: CC: f/u knee pain  Feels  better, less itching, ready to go home   O: Vitals:  Vitals:   08/23/20 0753 08/23/20 1148  BP: (!) 142/76 (!) 142/79  Pulse: 62 60  Resp: 18 18  Temp: 98.1 F (36.7 C) 97.7 F (36.5 C)  SpO2: 99% 97%    Constitutional:  . Appears calm and comfortable ENMT:  . grossly normal hearing . Lips and tongue w/o gross abnormalities other than some lip readness  Respiratory:  . CTA bilaterally, no w/r/r.  . Respiratory effort normal.  Cardiovascular:  . RRR, no m/r/g . No LE extremity edema   Skin:  . Rash maculopapular BLE, BUE mostly fading, rash on face decreased Psychiatric:  . judgement and insight appear normal . Mental status o Mood, affect appropriate  Blood cultures NGTD  Discharge Instructions  Discharge Instructions    Diet - low sodium heart healthy   Complete by: As directed    Discharge instructions   Complete by: As directed    Call your physician or seek immediate medical attention for pain, fever, rash, swelling, difficulty breathing or worsening of condition.   Increase activity slowly   Complete by: As directed      Allergies as of 08/23/2020      Reactions   Dilaudid [hydromorphone Hcl] Hives   Pregabalin    Dizziness       Medication List    STOP taking these medications   buPROPion 150 MG 24 hr tablet Commonly known as: Wellbutrin XL   HYDROcodone-acetaminophen 5-325 MG tablet Commonly known as: Norco   phentermine 37.5 MG capsule     TAKE these medications   acetaminophen 500 MG tablet Commonly known as: TYLENOL Take 1 tablet (500 mg total) by mouth every 6 (six) hours as needed for mild pain, fever or headache (or Fever >/= 101).   ALPRAZolam 0.5 MG tablet Commonly known as: XANAX TAKE ONE TABLET EVERY DAY AS NEEDED FOR ANXIETY   amitriptyline 25 MG tablet Commonly known as: ELAVIL Take 1 tablet (25 mg total) by mouth at bedtime.   aspirin 81 MG chewable tablet Chew 1 tablet (81 mg total) by mouth 2 (two) times daily.    cyanocobalamin 1000 MCG/ML injection Commonly known as: (VITAMIN B-12) Inject 1 mL (1,000 mcg total) into the muscle every 30 (thirty) days.   diphenhydrAMINE 25 mg capsule Commonly known as: BENADRYL Take 1 capsule (25 mg total) by mouth every 6 (six) hours as needed for itching or allergies.   meloxicam 15 MG tablet Commonly known as: MOBIC Take 15 mg by mouth daily as needed for pain.   methocarbamol 500 MG tablet Commonly known as: ROBAXIN Take 1 tablet (500 mg total) by mouth every 6 (six) hours as needed for muscle spasms.   nystatin 100000 UNIT/ML suspension Commonly known as: MYCOSTATIN Take 5 mLs (500,000 Units total) by mouth 4 (four) times daily. Use for 48 hours after resolution of thrush   nystatin cream Commonly known as: MYCOSTATIN Apply topically 2 (two) times daily.   predniSONE 10 MG tablet Commonly known as: DELTASONE Take 4 tablets (40 mg total) by mouth daily with breakfast for 3 days, THEN 2 tablets (20 mg total) daily with breakfast for  3 days, THEN 1 tablet (10 mg total) daily with breakfast for 3 days. Start taking on: August 23, 2020   rOPINIRole 2 MG tablet Commonly known as: REQUIP Take 2 mg by mouth at bedtime.   sertraline 100 MG tablet Commonly known as: ZOLOFT Take 100 mg by mouth at bedtime.   thyroid 120 MG tablet Commonly known as: ARMOUR Take 180 mg by mouth daily before breakfast.   Vitamin D (Ergocalciferol) 1.25 MG (50000 UNIT) Caps capsule Commonly known as: DRISDOL Take 50,000 Units by mouth 2 (two) times a week.      Allergies  Allergen Reactions  . Dilaudid [Hydromorphone Hcl] Hives  . Pregabalin     Dizziness     The results of significant diagnostics from this hospitalization (including imaging, microbiology, ancillary and laboratory) are listed below for reference.    Significant Diagnostic Studies: CT FEMUR LEFT WO CONTRAST  Result Date: 08/19/2020 CLINICAL DATA:  Left leg pain and swelling, 7 days status post  left knee surgery. EXAM: CT OF THE LOWER LEFT EXTREMITY WITHOUT CONTRAST TECHNIQUE: Multidetector CT imaging of the lower left extremity was performed according to the standard protocol. COMPARISON:  None. FINDINGS: Bones/Joint/Cartilage A left knee replacement is seen with an extensive amount of associated streak artifact. Subsequently limited evaluation of the adjacent osseous and soft tissue structures is noted. There is no evidence of an acute fracture or dislocation. A large joint effusion is seen. Ligaments Suboptimally assessed by CT. Muscles and Tendons A small amount of para muscular soft tissue air is seen along the anteromedial aspect of the distal right thigh. Soft tissues Mild subcutaneous and para muscular inflammatory fat stranding is seen along the anterior and lateral aspects of the left knee. Multiple anterior skin staples are present. There is no evidence of associated fluid collection or abscess. IMPRESSION: 1. Status post left knee replacement. 2. Large joint effusion. 3. Mild cellulitis along the anterior and lateral aspects of the left knee without evidence of an associated fluid collection or abscess. Electronically Signed   By: Aram Candela M.D.   On: 08/19/2020 22:13   US Venous Img Lower Unilateral Left  Result Date: 08/19/2020 CLINICAL DATA:  Left lower extremity pain EXAM: Left  LOWER EXTREMITY VENOUS DOPPLER ULTRASOUND TECHNIQUE: Gray-scale sonography with compression, as well as color and duplex ultrasound, were performed to evaluate the deep venous system(s) from the level of the common femoral vein through the popliteal and proximal calf veins. COMPARISON:  None. FINDINGS: VENOUS Normal compressibility of the common femoral, superficial femoral, and popliteal veins, as well as the visualized calf veins. Visualized portions of profunda femoral vein and great saphenous vein unremarkable. No filling defects to suggest DVT on grayscale or color Doppler imaging. Doppler waveforms  show normal direction of venous flow, normal respiratory plasticity and response to augmentation. Limited views of the contralateral common femoral vein are unremarkable. OTHER None. Limitations: none IMPRESSION: Negative. Electronically Signed   By: Jonna Clark M.D.   On: 08/19/2020 23:08   US Venous Img Lower Unilateral Left (DVT)  Result Date: 08/17/2020 CLINICAL DATA:  Left lower extremity pain. EXAM: LEFT LOWER EXTREMITY VENOUS DOPPLER ULTRASOUND TECHNIQUE: Gray-scale sonography with compression, as well as color and duplex ultrasound, were performed to evaluate the deep venous system(s) from the level of the common femoral vein through the popliteal and proximal calf veins. COMPARISON:  None. FINDINGS: VENOUS Normal compressibility of the common femoral, superficial femoral, and popliteal veins, as well as the visualized calf veins. Visualized  portions of profunda femoral vein and great saphenous vein unremarkable. No filling defects to suggest DVT on grayscale or color Doppler imaging. Doppler waveforms show normal direction of venous flow, normal respiratory plasticity and response to augmentation. Limited views of the contralateral common femoral vein are unremarkable. IMPRESSION: No evidence of DVT. Electronically Signed   By: Feliberto Harts MD   On: 08/17/2020 12:19   DG Chest Port 1 View  Result Date: 08/19/2020 CLINICAL DATA:  Postop knee surgery. EXAM: PORTABLE CHEST 1 VIEW COMPARISON:  December 08, 2010 FINDINGS: The heart size and mediastinal contours are within normal limits. Both lungs are clear. The visualized skeletal structures are unremarkable. IMPRESSION: No active disease. Electronically Signed   By: Katherine Mantle M.D.   On: 08/19/2020 21:18    Microbiology: Recent Results (from the past 240 hour(s))  Resp Panel by RT-PCR (Flu A&B, Covid) Nasopharyngeal Swab     Status: None   Collection Time: 08/19/20  8:56 PM   Specimen: Nasopharyngeal Swab; Nasopharyngeal(NP) swabs in  vial transport medium  Result Value Ref Range Status   SARS Coronavirus 2 by RT PCR NEGATIVE NEGATIVE Final    Comment: (NOTE) SARS-CoV-2 target nucleic acids are NOT DETECTED.  The SARS-CoV-2 RNA is generally detectable in upper respiratory specimens during the acute phase of infection. The lowest concentration of SARS-CoV-2 viral copies this assay can detect is 138 copies/mL. A negative result does not preclude SARS-Cov-2 infection and should not be used as the sole basis for treatment or other patient management decisions. A negative result may occur with  improper specimen collection/handling, submission of specimen other than nasopharyngeal swab, presence of viral mutation(s) within the areas targeted by this assay, and inadequate number of viral copies(<138 copies/mL). A negative result must be combined with clinical observations, patient history, and epidemiological information. The expected result is Negative.  Fact Sheet for Patients:  BloggerCourse.com  Fact Sheet for Healthcare Providers:  SeriousBroker.it  This test is no t yet approved or cleared by the [place] FDA and  has been authorized for detection and/or diagnosis of SARS-CoV-2 by FDA under an Emergency Use Authorization (EUA). This EUA will remain  in effect (meaning this test can be used) for the duration of the COVID-19 declaration under Section 564(b)(1) of the Act, 21 U.S.C.section 360bbb-3(b)(1), unless the authorization is terminated  or revoked sooner.       Influenza A by PCR NEGATIVE NEGATIVE Final   Influenza B by PCR NEGATIVE NEGATIVE Final    Comment: (NOTE) The Xpert Xpress SARS-CoV-2/FLU/RSV plus assay is intended as an aid in the diagnosis of influenza from Nasopharyngeal swab specimens and should not be used as a sole basis for treatment. Nasal washings and aspirates are unacceptable for Xpert Xpress SARS-CoV-2/FLU/RSV testing.  Fact  Sheet for Patients: BloggerCourse.com  Fact Sheet for Healthcare Providers: SeriousBroker.it  This test is not yet approved or cleared by the [place] FDA and has been authorized for detection and/or diagnosis of SARS-CoV-2 by FDA under an Emergency Use Authorization (EUA). This EUA will remain in effect (meaning this test can be used) for the duration of the COVID-19 declaration under Section 564(b)(1) of the Act, 21 U.S.C. section 360bbb-3(b)(1), unless the authorization is terminated or revoked.  Performed at [facility], [address] [place], [place] [address]   Blood Culture (routine x 2)     Status: None (Preliminary result)   Collection Time: 08/19/20  8:56 PM   Specimen: BLOOD  Result Value Ref Range  Status   Specimen Description BLOOD LEFT ANTECUBITAL  Final   Special Requests   Final    BOTTLES DRAWN AEROBIC AND ANAEROBIC Blood Culture results may not be optimal due to an excessive volume of blood received in culture bottles   Culture   Final    NO GROWTH 4 DAYS Performed at [facility], [address] [place], [place] [address]    Report Status PENDING  Incomplete  Blood Culture (routine x 2)     Status: None (Preliminary result)   Collection Time: 08/19/20  9:19 PM   Specimen: BLOOD  Result Value Ref Range Status   Specimen Description BLOOD RIGHT ANTECUBITAL  Final   Special Requests   Final    BOTTLES DRAWN AEROBIC AND ANAEROBIC Blood Culture adequate volume   Culture   Final    NO GROWTH 4 DAYS Performed at [facility], [address], [place], [place] [address]    Report Status PENDING  Incomplete  Surgical pcr screen     Status: None   Collection Time: 08/20/20  3:42 PM   Specimen: Nasal Mucosa; Nasal Swab  Result Value Ref Range Status   MRSA, PCR NEGATIVE NEGATIVE Final   Staphylococcus aureus NEGATIVE NEGATIVE Final    Comment: (NOTE) The Xpert SA Assay (FDA  approved for NASAL specimens in patients 98 years of age and older), is one component of a comprehensive surveillance program. It is not intended to diagnose infection nor to guide or monitor treatment. Performed at [facility], [address] [place], [place] [address]      Labs: Basic Metabolic Panel: Recent Labs  Lab 08/19/20 2056 08/20/20 0455 08/21/20 0500  NA 134* 136 137  K 3.8 3.8 3.7  CL 103 105 107  CO2 21* 22 21*  GLUCOSE 101* 91 80  BUN CREATININE 0.96 0.84 0.81  CALCIUM 8.8* 8.4* 8.3*   Liver Function Tests: Recent Labs  Lab 08/19/20 2056  AST 49*  ALT 35  ALKPHOS 80  BILITOT 1.1  PROT 6.9  ALBUMIN 3.5   CBC: Recent Labs  Lab 08/19/20 2056 08/20/20 0455 08/21/20 0500  WBC 6.3 4.8 5.1  NEUTROABS 3.9  --   --   HGB 11.5* 11.7* 12.5  HCT 34.4* 36.6 38.1  MCV 85.6 88.6 89.2  PLT 226 193 160    Principal Problem:   Cellulitis of left lower extremity Active Problems:   Anxiety   Hypothyroidism   Obesity, Class III, BMI 40-49.9 (morbid obesity) (HCC)   Hives   Drug reaction   Time coordinating discharge: 35 minutes  Signed:  Brendia Sacks, MD  [facility]  08/23/2020, 3:24 PM

## 2020-08-23 NOTE — TOC Progression Note (Addendum)
Transition of Care [facility]) - Progression Note    Patient Details  Name: SIMRIT GOHLKE MRN: [id number] Date of Birth: March 28, 1966  [facility] of [facility]) CM/SW Contact  Caryn Section, RN Phone Number: 08/23/2020, 2:50 PM  Clinical Narrative:   Update 1515pm:  [facility] will accept patient tomorrow.  Patient notified.  TOC will follow through discharge as needed. TOC in to see patient at bedside to discuss discharge planning.  Introductions made, patient amenable to discussion.  Recommendation is for home health, [facility] requested [facility].  [facility] not in network.  TOC contacted [facility], awaiting response.  Patient aware and TOC will follow up through discharge.        Expected Discharge Plan and Services                                                 Social Determinants of Health (SDOH) Interventions    Readmission Risk Interventions No flowsheet data found.

## 2020-08-24 LAB — CULTURE, BLOOD (ROUTINE X 2)
Culture: NO GROWTH
Culture: NO GROWTH
Special Requests: ADEQUATE

## 2020-08-31 ENCOUNTER — Encounter: Payer: Self-pay | Admitting: Infectious Diseases

## 2020-08-31 ENCOUNTER — Other Ambulatory Visit: Payer: Self-pay

## 2020-08-31 ENCOUNTER — Ambulatory Visit: Payer: BLUE CROSS/BLUE SHIELD | Attending: Infectious Diseases | Admitting: Infectious Diseases

## 2020-08-31 VITALS — BP 130/86 | HR 92 | Temp 97.8°F | Resp 16 | Ht 66.0 in | Wt 249.0 lb

## 2020-08-31 DIAGNOSIS — Z79899 Other long term (current) drug therapy: Secondary | ICD-10-CM | POA: Insufficient documentation

## 2020-08-31 DIAGNOSIS — B001 Herpesviral vesicular dermatitis: Secondary | ICD-10-CM | POA: Insufficient documentation

## 2020-08-31 DIAGNOSIS — R21 Rash and other nonspecific skin eruption: Secondary | ICD-10-CM

## 2020-08-31 DIAGNOSIS — Z7982 Long term (current) use of aspirin: Secondary | ICD-10-CM | POA: Insufficient documentation

## 2020-08-31 DIAGNOSIS — Z981 Arthrodesis status: Secondary | ICD-10-CM | POA: Insufficient documentation

## 2020-08-31 DIAGNOSIS — Z791 Long term (current) use of non-steroidal anti-inflammatories (NSAID): Secondary | ICD-10-CM | POA: Diagnosis not present

## 2020-08-31 DIAGNOSIS — B009 Herpesviral infection, unspecified: Secondary | ICD-10-CM | POA: Diagnosis not present

## 2020-08-31 DIAGNOSIS — L270 Generalized skin eruption due to drugs and medicaments taken internally: Secondary | ICD-10-CM

## 2020-08-31 DIAGNOSIS — Z9884 Bariatric surgery status: Secondary | ICD-10-CM | POA: Insufficient documentation

## 2020-08-31 DIAGNOSIS — Z96652 Presence of left artificial knee joint: Secondary | ICD-10-CM | POA: Diagnosis not present

## 2020-08-31 DIAGNOSIS — Z09 Encounter for follow-up examination after completed treatment for conditions other than malignant neoplasm: Secondary | ICD-10-CM | POA: Insufficient documentation

## 2020-08-31 MED ORDER — ACYCLOVIR 5 % EX OINT: 1.0000 "application " | TOPICAL_OINTMENT | CUTANEOUS | 0 refills | Status: AC

## 2020-08-31 NOTE — Progress Notes (Signed)
NAME: Marie Booker  DOB: 1965-12-19  MRN: [id number]  Date/Time: 08/31/2020 11:08 AM   Subjective:   ? Marie Booker is a 55 y.o.female  with a history of Bariatric surgery  By robotic lap on 05/20/20, depression,OSA,  Lumbar sugery ( L4-L5 hemilaminectomy) for lumbar radiculopathy complicated by MSSA infection needing 3 months of IV cefazolin a few ears ago  left TKA Last week of feb 2022, was recently in [facility] between 08/19/20 -08/23/20 for fever and initially was thought to have cellulitis of the left thigh/surgical area. Then in a few hours after receiving dilaudid and vanco IV she developed HIVEs and erythema- Vanco was stopped- dilaudid was continued and then switched to morphine but the erythematous rash progressed with pruritus. She remembered that she had a similar rash 10 yrs ago when she got IV dilaudid but recently after total knee replacement was taking PO dilaudid with no issues. She was thought not to have cellulitis as the urgical site was normal and cephalosporin Iv was stopped She was given prednisone taper  for a drug rash. She is here for follow up and doing very well- rash has resolved She has a painful blistering lesion lower lip She saw ortho as OP and staples removed No fever She drove to this visit  Past Medical History:  Diagnosis Date  . Anemia    iron infusion 4 years ago due to heavy periods  . Anxiety   . Arthritis   . Asthma   . Family history of adverse reaction to anesthesia    mom-hard time waking up  . Hypothyroidism   . Pre-diabetes   . RLS (restless legs syndrome)   . Sleep apnea   . Thyroid disease     Past Surgical History:  Procedure Laterality Date  . ABDOMINAL HYSTERECTOMY    . APPENDECTOMY    . BACK SURGERY     lower  . CARPAL TUNNEL RELEASE Left 11/07/2017   Procedure: CARPAL TUNNEL RELEASE;  Surgeon: Deeann Saint, MD;  Location: [facility];  Service: Orthopedics;  Laterality: Left;  . CYSTOCELE REPAIR N/A 02/17/2019   Procedure:  ANTERIOR REPAIR (CYSTOCELE);  Surgeon: Hildred Laser, MD;  Location: [facility];  Service: Gynecology;  Laterality: N/A;  . CYSTOSCOPY N/A 02/17/2019   Procedure: CYSTOSCOPY;  Surgeon: Hildred Laser, MD;  Location: [facility];  Service: Gynecology;  Laterality: N/A;  . ENDOMETRIAL ABLATION    . LAPAROSCOPIC VAGINAL HYSTERECTOMY WITH SALPINGO OOPHORECTOMY Bilateral 02/17/2019   Procedure: LAPAROSCOPIC ASSISTED VAGINAL HYSTERECTOMY WITH BILATERAL SALPINGO OOPHORECTOMY;  Surgeon: Hildred Laser, MD;  Location: [facility];  Service: Gynecology;  Laterality: Bilateral;  . LEG SURGERY    . PUBOVAGINAL SLING N/A 02/17/2019   Procedure: Leonides Grills;  Surgeon: Hildred Laser, MD;  Location: [facility];  Service: Gynecology;  Laterality: N/A;    Social History   Socioeconomic History  . Marital status: Divorced    Spouse name: Not on file  . Number of children: Not on file  . Years of education: Not on file  . Highest education level: Not on file  Occupational History  . Not on file  Tobacco Use  . Smoking status: Never Smoker  . Smokeless tobacco: Never Used  Vaping Use  . Vaping Use: Never used  Substance and Sexual Activity  . Alcohol use: No  . Drug use: No  . Sexual activity: Yes    Birth control/protection: Post-menopausal  Other Topics Concern  . Not on file  Social History Narrative  .  Not on file   Social Determinants of Health   Financial Resource Strain: Not on file  Food Insecurity: Not on file  Transportation Needs: Not on file  Physical Activity: Not on file  Stress: Not on file  Social Connections: Not on file  Intimate Partner Violence: Not on file    Family History  Problem Relation Age of Onset  . COPD Mother    Allergies  Allergen Reactions  . Dilaudid [Hydromorphone Hcl] Hives  . Pregabalin     Dizziness    I? Current Outpatient Medications  Medication Sig Dispense Refill  . acetaminophen (TYLENOL) 500 MG tablet Take 1 tablet (500 mg total) by mouth every  6 (six) hours as needed for mild pain, fever or headache (or Fever >/= 101). 30 tablet 0  . ALPRAZolam (XANAX) 0.5 MG tablet TAKE ONE TABLET EVERY DAY AS NEEDED FOR ANXIETY 90 tablet 0  . aspirin 81 MG chewable tablet Chew 1 tablet (81 mg total) by mouth 2 (two) times daily.    . cyanocobalamin (,VITAMIN B-12,) 1000 MCG/ML injection Inject 1 mL (1,000 mcg total) into the muscle every 30 (thirty) days. 10 mL 1  . diphenhydrAMINE (BENADRYL) 25 mg capsule Take 1 capsule (25 mg total) by mouth every 6 (six) hours as needed for itching or allergies.  0  . meloxicam (MOBIC) 15 MG tablet Take 15 mg by mouth daily as needed for pain.    . methocarbamol (ROBAXIN) 500 MG tablet Take 1 tablet (500 mg total) by mouth every 6 (six) hours as needed for muscle spasms. 20 tablet 0  . nystatin (MYCOSTATIN) 100000 UNIT/ML suspension Take 5 mLs (500,000 Units total) by mouth 4 (four) times daily. Use for 48 hours after resolution of thrush 473 mL 0  . nystatin cream (MYCOSTATIN) Apply topically 2 (two) times daily. 30 g 0  . rOPINIRole (REQUIP) 2 MG tablet Take 2 mg by mouth at bedtime.    . sertraline (ZOLOFT) 100 MG tablet Take 100 mg by mouth at bedtime.     Marland Kitchen thyroid (ARMOUR) 120 MG tablet Take 180 mg by mouth daily before breakfast.     . Vitamin D, Ergocalciferol, (DRISDOL) 50000 units CAPS capsule Take 50,000 Units by mouth 2 (two) times a week.     No current facility-administered medications for this visit.     Abtx:  Anti-infectives (From admission, onward)   None      REVIEW OF SYSTEMS:  Const: negative fever, negative chills, negative weight loss Eyes: negative diplopia or visual changes, negative eye pain ENT: negative coryza, negative sore throat Resp: negative cough, hemoptysis, dyspnea Cards: negative for chest pain, palpitations, lower extremity edema GU: negative for frequency, dysuria and hematuria GI: Negative for abdominal pain, diarrhea, bleeding, constipation Skin:  rash and  pruritus resolved Heme: negative for easy bruising and gum/nose bleeding MS: left knee swelling down, pain much better Neurolo:negative for headaches, dizziness, vertigo, memory problems  Psych: negative for feelings of anxiety, depression  Endocrine: has hypothyroidism Allergy/Immunology-as above: Objective:  VITALS:  BP 130/86   Pulse 92   Temp 97.8 F (36.6 C) (Temporal)   Resp 16   Ht 5\' 6"  (1.676 m)   Wt 249 lb (112.9 kg)   LMP 10/31/2016   SpO2 96%   BMI 40.19 kg/m  PHYSICAL EXAM:  General: Alert, cooperative, no distress, appears stated age.  Head: Normocephalic, without obvious abnormality, atraumatic. Eyes: Conjunctivae clear, anicteric sclerae. Pupils are equal ENT Nares normal. No drainage or sinus  tenderness. Lips, blistering lesion lower lip    Neck: Supple, symmetrical, no adenopathy, thyroid: non tender no carotid bruit and no JVD. Back: No CVA tenderness. Lungs: Clear to auscultation bilaterally. No Wheezing or Rhonchi. No rales. Heart: Regular rate and rhythm, no murmur, rub or gallop. Abdomen: Soft, non-tender,not distended. Bowel sounds normal. No masses Extremities: atraumatic, no cyanosis. No edema. No clubbing Skin:      08/23/20    No rashes or lesions. Or bruising Lymph: Cervical, supraclavicular normal. Neurologic: Grossly non-focal Pertinent Labs Lab Results CBC    Component Value Date/Time   WBC 5.1 08/21/2020 0500   RBC 4.27 08/21/2020 0500   HGB 12.5 08/21/2020 0500   HGB 11.3 (L) 09/19/2014 2224   HCT 38.1 08/21/2020 0500   HCT 36.2 09/19/2014 2224   PLT 160 08/21/2020 0500   PLT 283 09/19/2014 2224   MCV 89.2 08/21/2020 0500   MCV 80 09/19/2014 2224   MCH 29.3 08/21/2020 0500   MCHC 32.8 08/21/2020 0500   RDW 13.2 08/21/2020 0500   RDW 15.7 (H) 09/19/2014 2224   LYMPHSABS 1.5 08/19/2020 2056   MONOABS 0.7 08/19/2020 2056   EOSABS 0.2 08/19/2020 2056   BASOSABS 0.0 08/19/2020 2056    CMP Latest Ref Rng & Units  08/21/2020 08/20/2020 08/19/2020  Glucose 70 - 99 mg/dL 80 91 706(C)  BUN 6 - 20 mg/dL 14 15 18   Creatinine 0.44 - 1.00 mg/dL 3.76 2.83  Sodium 135 - 145 mmol/L 137 136 134(L)  Potassium 3.5 - 5.1 mmol/L 3.7 3.8 3.8  Chloride 98 - 111 mmol/L 107 105 103  CO2 22 - 32 mmol/L 21(L) 22 21(L)  Calcium 8.9 - 10.3 mg/dL 8.3(L) 8.4(L) 8.8(L)  Total Protein 6.5 - 8.1 g/dL - - 6.9  Total Bilirubin 0.3 - 1.2 mg/dL - - 1.1  Alkaline Phos 38 - 126 U/L - - 80  AST 15 - 41 U/L - - 49(H)  ALT 0 - 44 U/L - - 35   ? Impression/Recommendation ? 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 She has used zovirax oint before- will send prescription  Left TKA- no evidence of infection- staples removed  H/o lumbar fusion complicated by MSSA infection many years ago and treated with IV cefazolin  H/O bariatric surgery  Discussed the management with patient, Will follow PRN ? ?

## 2020-08-31 NOTE — Patient Instructions (Addendum)
You are here for follow up of the skin rash, knee swelling- the rash was an allergic reaction to dilaudid-  the rash has  Resolved. You have cold sore on your lower lip- can do zovirax ointment as you do not want to take anything internal.

## 2021-12-06 ENCOUNTER — Encounter: Payer: Self-pay | Admitting: Emergency Medicine

## 2021-12-06 ENCOUNTER — Ambulatory Visit
Admission: EM | Admit: 2021-12-06 | Discharge: 2021-12-06 | Disposition: A | Discharge status: Home / Self Care | Payer: BLUE CROSS/BLUE SHIELD | Attending: Family Medicine | Admitting: Family Medicine

## 2021-12-06 DIAGNOSIS — S61213A Laceration without foreign body of left middle finger without damage to nail, initial encounter: Secondary | ICD-10-CM | POA: Diagnosis not present

## 2021-12-06 MED ORDER — TETANUS-DIPHTH-ACELL PERTUSSIS 5-2.5-18.5 LF-MCG/0.5 IM SUSY
0.5000 mL | PREFILLED_SYRINGE | Freq: Once | INTRAMUSCULAR | Status: AC
Start: 2021-12-06 — End: 2021-12-06
  Administered 2021-12-06: 0.5 mL via INTRAMUSCULAR

## 2021-12-06 NOTE — ED Triage Notes (Signed)
Pt has a laceration to her left middle finger today.

## 2021-12-06 NOTE — ED Provider Notes (Signed)
[facility]    CSN: [id number] Arrival date & time: 12/06/21  1500      History   Chief Complaint Chief Complaint  Patient presents with   Laceration    HPI Marie Booker is a 56 y.o. female.   HPI Patient present for evaluation of left middle finger laceration after accidentally cutting her finger less than one hour ago today. She is uncertain of the date of last TDAP. Bleeding is well-controlled.  Past Medical History:  Diagnosis Date   Anemia    iron infusion 4 years ago due to heavy periods   Anxiety    Arthritis    Asthma    Family history of adverse reaction to anesthesia    mom-hard time waking up   Hypothyroidism    Pre-diabetes    RLS (restless legs syndrome)    Sleep apnea    Thyroid disease     Patient Active Problem List   Diagnosis Date Noted   Hives 08/21/2020   Drug reaction 08/21/2020   Obesity, Class III, BMI 40-49.9 (morbid obesity) (HCC) 08/19/2020   Cellulitis of left lower extremity 08/19/2020   Status post laparoscopic hysterectomy 02/17/2019   History of uterine fibroid 09/24/2017   Hypothyroidism 09/24/2017   Obesity 09/24/2017   OSA (obstructive sleep apnea) 09/24/2017   Restless leg syndrome 09/24/2017   Carpal tunnel syndrome of left wrist 06/28/2017   Osteoarthritis of carpometacarpal (CMC) joint of thumb 04/27/2017   Anxiety 04/14/2016   Bronchitis 06/10/2015   Major depressive disorder 03/22/2015   Iron deficiency anemia, unspecified 11/10/2014    Past Surgical History:  Procedure Laterality Date   ABDOMINAL HYSTERECTOMY     APPENDECTOMY     BACK SURGERY     lower   CARPAL TUNNEL RELEASE Left 11/07/2017   Procedure: CARPAL TUNNEL RELEASE;  Surgeon: Deeann Saint, MD;  Location: [facility];  Service: Orthopedics;  Laterality: Left;   CYSTOCELE REPAIR N/A 02/17/2019   Procedure: ANTERIOR REPAIR (CYSTOCELE);  Surgeon: Hildred Laser, MD;  Location: [facility];  Service: Gynecology;  Laterality: N/A;   CYSTOSCOPY N/A  02/17/2019   Procedure: CYSTOSCOPY;  Surgeon: Hildred Laser, MD;  Location: [facility];  Service: Gynecology;  Laterality: N/A;   ENDOMETRIAL ABLATION     LAPAROSCOPIC VAGINAL HYSTERECTOMY WITH SALPINGO OOPHORECTOMY Bilateral 02/17/2019   Procedure: LAPAROSCOPIC ASSISTED VAGINAL HYSTERECTOMY WITH BILATERAL SALPINGO OOPHORECTOMY;  Surgeon: Hildred Laser, MD;  Location: [facility];  Service: Gynecology;  Laterality: Bilateral;   LEG SURGERY     PUBOVAGINAL SLING N/A 02/17/2019   Procedure: Leonides Grills;  Surgeon: Hildred Laser, MD;  Location: [facility];  Service: Gynecology;  Laterality: N/A;    OB History     Gravida  3   Para  3   Term      Preterm      AB      Living  3      SAB      IAB      Ectopic      Multiple      Live Births               Home Medications    Prior to Admission medications   Medication Sig Start Date End Date Taking? Authorizing Provider  ALPRAZolam Prudy Feeler) 0.5 MG tablet TAKE ONE TABLET EVERY DAY AS NEEDED FOR ANXIETY 04/04/19  Yes Hildred Laser, MD  cyanocobalamin (,VITAMIN B-12,) 1000 MCG/ML injection Inject 1 mL (1,000 mcg total) into the muscle every 30 (thirty)  days. 08/13/18  Yes Shambley, Melody N, CNM  rOPINIRole (REQUIP) 2 MG tablet Take 2 mg by mouth at bedtime.   Yes [provider]  sertraline (ZOLOFT) 100 MG tablet Take 100 mg by mouth at bedtime.    Yes [provider]  thyroid (ARMOUR) 120 MG tablet Take 180 mg by mouth daily before breakfast.    Yes [provider]  Vitamin D, Ergocalciferol, (DRISDOL) 50000 units CAPS capsule Take 50,000 Units by mouth 2 (two) times a week.   Yes [provider]  acetaminophen (TYLENOL) 500 MG tablet Take 1 tablet (500 mg total) by mouth every 6 (six) hours as needed for mild pain, fever or headache (or Fever >/= 101). 08/23/20   Standley Brooking, MD  acyclovir ointment (ZOVIRAX) 5 % Apply 1 application topically every 3 (three) hours. 08/31/20   Lynn Ito, MD  aspirin 81 MG chewable tablet Chew 1 tablet (81 mg total) by mouth 2 (two) times daily. 08/23/20   Standley Brooking, MD  diphenhydrAMINE (BENADRYL) 25 mg capsule Take 1 capsule (25 mg total) by mouth every 6 (six) hours as needed for itching or allergies. 08/23/20   Standley Brooking, MD  meloxicam (MOBIC) 15 MG tablet Take 15 mg by mouth daily as needed for pain.    [provider]  methocarbamol (ROBAXIN) 500 MG tablet Take 1 tablet (500 mg total) by mouth every 6 (six) hours as needed for muscle spasms. 08/23/20   Standley Brooking, MD  nystatin (MYCOSTATIN) 100000 UNIT/ML suspension Take 5 mLs (500,000 Units total) by mouth 4 (four) times daily. Use for 48 hours after resolution of thrush 08/23/20   Standley Brooking, MD  nystatin cream (MYCOSTATIN) Apply topically 2 (two) times daily. 03/12/19   Hildred Laser, MD    Family History Family History  Problem Relation Age of Onset   COPD Mother     Social History Social History   Tobacco Use   Smoking status: Never   Smokeless tobacco: Never  Vaping Use   Vaping Use: Never used  Substance Use Topics   Alcohol use: No   Drug use: No     Allergies   Dilaudid [hydromorphone hcl] and Pregabalin   Review of Systems Review of Systems Pertinent negatives listed in HPI  Physical Exam Triage Vital Signs ED Triage Vitals [12/06/21 1509]  Enc Vitals Group     BP 139/84     Pulse Rate 62     Resp 18     Temp 98.2 F (36.8 C)     Temp Source Oral     SpO2 96 %     Weight      Height      Head Circumference      Peak Flow      Pain Score      Pain Loc      Pain Edu?      Excl. in GC?    No data found.  Updated Vital Signs BP 139/84 (BP Location: Left Arm)   Pulse 62   Temp 98.2 F (36.8 C) (Oral)   Resp 18   LMP 10/31/2016   SpO2 96%   Visual Acuity Right Eye Distance:   Left Eye Distance:   Bilateral Distance:    Right Eye Near:   Left Eye Near:    Bilateral Near:     Physical  Exam General appearance: Alert, well developed, well nourished, cooperative Head: Normocephalic, without obvious abnormality, atraumatic Heart:  Rate and rhythm normal. No gallop or murmurs noted on exam  Respiratory: Respirations even and unlabored, normal respiratory rate Abdomen: BS +, no distention, no rebound tenderness, or no mass Finger: left middle finger, 3.5 cm well approximated laceration Skin: Skin color, texture, turgor normal. No rashes seen  Neurologic: No focal abnormality  Psych: Appropriate mood and affect. UC Treatments / Results  Labs (all labs ordered are listed, but only abnormal results are displayed) Labs Reviewed - No data to display  EKG   Radiology No results found.  Procedures Laceration Repair  Date/Time: 12/06/2021 3:45 PM  Performed by: Bing Neighbors, FNP Authorized by: Bing Neighbors, FNP   Consent:    Consent obtained:  Verbal   Risks discussed:  Pain and infection   Alternatives discussed:  No treatment Universal protocol:    Patient identity confirmed:  Verbally with patient Anesthesia:    Anesthesia method:  Local infiltration and topical application   Local anesthetic:  Lidocaine 1% w/o epi Laceration details:    Location:  Finger   Finger location:  L long finger   Length (cm):  3.5   Depth (mm):  1.5 Treatment:    Area cleansed with:  Povidone-iodine   Amount of cleaning:  Standard   Irrigation solution:  Sterile saline Skin repair:    Repair method:  Sutures   Suture size:  3-0   Suture material:  Prolene   Number of sutures:  4 Approximation:    Approximation:  Close Repair type:    Repair type:  Simple Post-procedure details:    Dressing:  Non-adherent dressing   Procedure completion:  Tolerated well, no immediate complications  (including critical care time)  Medications Ordered in UC Medications  Tdap (BOOSTRIX) injection 0.5 mL (0.5 mLs Intramuscular Given 12/06/21 1550)    Initial Impression /  Assessment and Plan / UC Course  I have reviewed the triage vital signs and the nursing notes.  Pertinent labs & imaging results that were available during my care of the patient were reviewed by me and considered in my medical decision making (see chart for details).    Laceration of left middle finger w/o foreign body Tolerated procedure. Return in 10 days for suture removal . Return sooner if needed. TDAP updated. Final Clinical Impressions(s) / UC Diagnoses   Final diagnoses:  Laceration of left middle finger w/o foreign body w/o damage to nail, initial encounter   Discharge Instructions   None    ED Prescriptions   None    PDMP not reviewed this encounter.   Bing Neighbors, FNP 12/08/21 1726
